# Patient Record
Sex: Male | Born: 1965 | Race: White | Hispanic: No | Marital: Married | Smoking: Former smoker
Health system: Southern US, Community
[De-identification: ages and names within clinical notes are randomized; demographics above are authoritative.]

## PROBLEM LIST (undated history)

## (undated) DIAGNOSIS — I739 Peripheral vascular disease, unspecified: Secondary | ICD-10-CM

## (undated) DIAGNOSIS — K219 Gastro-esophageal reflux disease without esophagitis: Secondary | ICD-10-CM

## (undated) DIAGNOSIS — Z720 Tobacco use: Secondary | ICD-10-CM

## (undated) DIAGNOSIS — I10 Essential (primary) hypertension: Secondary | ICD-10-CM

## (undated) DIAGNOSIS — E119 Type 2 diabetes mellitus without complications: Secondary | ICD-10-CM

## (undated) DIAGNOSIS — K811 Chronic cholecystitis: Secondary | ICD-10-CM

## (undated) DIAGNOSIS — I219 Acute myocardial infarction, unspecified: Secondary | ICD-10-CM

## (undated) DIAGNOSIS — F419 Anxiety disorder, unspecified: Secondary | ICD-10-CM

## (undated) HISTORY — PX: KNEE ARTHROSCOPY: SUR90

## (undated) HISTORY — DX: Peripheral vascular disease, unspecified: I73.9

---

## 2001-03-15 ENCOUNTER — Inpatient Hospital Stay (HOSPITAL_COMMUNITY): Admission: AD | Admit: 2001-03-15 | Discharge: 2001-03-18 | Payer: Self-pay | Admitting: Internal Medicine

## 2004-01-10 HISTORY — PX: CARDIAC CATHETERIZATION: SHX172

## 2004-03-11 ENCOUNTER — Ambulatory Visit (HOSPITAL_COMMUNITY): Admission: RE | Admit: 2004-03-11 | Discharge: 2004-03-11 | Payer: Self-pay | Admitting: Internal Medicine

## 2014-12-29 DIAGNOSIS — I2510 Atherosclerotic heart disease of native coronary artery without angina pectoris: Secondary | ICD-10-CM

## 2014-12-29 HISTORY — DX: Atherosclerotic heart disease of native coronary artery without angina pectoris: I25.10

## 2015-01-06 ENCOUNTER — Encounter (HOSPITAL_COMMUNITY): Payer: Self-pay | Admitting: Physician Assistant

## 2015-01-06 ENCOUNTER — Inpatient Hospital Stay (HOSPITAL_COMMUNITY)
Admission: EM | Admit: 2015-01-06 | Discharge: 2015-01-12 | DRG: 234 | Disposition: A | Discharge status: Home / Self Care | Payer: BLUE CROSS/BLUE SHIELD | Source: Other Acute Inpatient Hospital | Attending: Surgery | Admitting: Surgery

## 2015-01-06 DIAGNOSIS — I4892 Unspecified atrial flutter: Secondary | ICD-10-CM | POA: Diagnosis not present

## 2015-01-06 DIAGNOSIS — I4891 Unspecified atrial fibrillation: Secondary | ICD-10-CM | POA: Diagnosis not present

## 2015-01-06 DIAGNOSIS — F17210 Nicotine dependence, cigarettes, uncomplicated: Secondary | ICD-10-CM | POA: Diagnosis not present

## 2015-01-06 DIAGNOSIS — J90 Pleural effusion, not elsewhere classified: Secondary | ICD-10-CM | POA: Diagnosis not present

## 2015-01-06 DIAGNOSIS — Z79899 Other long term (current) drug therapy: Secondary | ICD-10-CM

## 2015-01-06 DIAGNOSIS — Z720 Tobacco use: Secondary | ICD-10-CM | POA: Diagnosis present

## 2015-01-06 DIAGNOSIS — E8770 Fluid overload, unspecified: Secondary | ICD-10-CM | POA: Diagnosis not present

## 2015-01-06 DIAGNOSIS — R079 Chest pain, unspecified: Secondary | ICD-10-CM | POA: Diagnosis present

## 2015-01-06 DIAGNOSIS — I25119 Atherosclerotic heart disease of native coronary artery with unspecified angina pectoris: Secondary | ICD-10-CM | POA: Diagnosis not present

## 2015-01-06 DIAGNOSIS — K219 Gastro-esophageal reflux disease without esophagitis: Secondary | ICD-10-CM | POA: Diagnosis not present

## 2015-01-06 DIAGNOSIS — F419 Anxiety disorder, unspecified: Secondary | ICD-10-CM | POA: Diagnosis present

## 2015-01-06 DIAGNOSIS — I214 Non-ST elevation (NSTEMI) myocardial infarction: Secondary | ICD-10-CM | POA: Diagnosis not present

## 2015-01-06 DIAGNOSIS — I10 Essential (primary) hypertension: Secondary | ICD-10-CM | POA: Diagnosis present

## 2015-01-06 DIAGNOSIS — Z951 Presence of aortocoronary bypass graft: Secondary | ICD-10-CM

## 2015-01-06 DIAGNOSIS — E119 Type 2 diabetes mellitus without complications: Secondary | ICD-10-CM | POA: Diagnosis not present

## 2015-01-06 DIAGNOSIS — Z7984 Long term (current) use of oral hypoglycemic drugs: Secondary | ICD-10-CM

## 2015-01-06 HISTORY — DX: Anxiety disorder, unspecified: F41.9

## 2015-01-06 HISTORY — DX: Essential (primary) hypertension: I10

## 2015-01-06 HISTORY — DX: Tobacco use: Z72.0

## 2015-01-06 HISTORY — DX: Type 2 diabetes mellitus without complications: E11.9

## 2015-01-06 HISTORY — DX: Gastro-esophageal reflux disease without esophagitis: K21.9

## 2015-01-06 LAB — HEPARIN LEVEL (UNFRACTIONATED)
HEPARIN UNFRACTIONATED: 0.51 [IU]/mL (ref 0.30–0.70)
Heparin Unfractionated: 0.19 IU/mL — ABNORMAL LOW (ref 0.30–0.70)

## 2015-01-06 LAB — MAGNESIUM: MAGNESIUM: 2 mg/dL (ref 1.7–2.4)

## 2015-01-06 LAB — GLUCOSE, CAPILLARY
GLUCOSE-CAPILLARY: 215 mg/dL — AB (ref 65–99)
Glucose-Capillary: 100 mg/dL — ABNORMAL HIGH (ref 65–99)
Glucose-Capillary: 126 mg/dL — ABNORMAL HIGH (ref 65–99)

## 2015-01-06 LAB — TROPONIN I
TROPONIN I: 1.22 ng/mL — AB (ref <0.031)
Troponin I: 1.74 ng/mL (ref <0.031)

## 2015-01-06 LAB — PROTIME-INR
INR: 1.01 (ref 0.00–1.49)
Prothrombin Time: 13.5 seconds (ref 11.6–15.2)

## 2015-01-06 LAB — TSH: TSH: 0.816 u[IU]/mL (ref 0.350–4.500)

## 2015-01-06 MED ORDER — CARVEDILOL 3.125 MG PO TABS
3.1250 mg | ORAL_TABLET | Freq: Two times a day (BID) | ORAL | Status: DC
  Administered 2015-01-06 – 2015-01-07 (×2): 3.125 mg via ORAL
  Filled 2015-01-06 (×2): qty 1

## 2015-01-06 MED ORDER — ALPRAZOLAM 0.5 MG PO TABS: 0.5000 mg | ORAL_TABLET | Freq: Three times a day (TID) | ORAL | Status: DC | PRN

## 2015-01-06 MED ORDER — ACETAMINOPHEN 325 MG PO TABS: 650.0000 mg | ORAL_TABLET | ORAL | Status: DC | PRN

## 2015-01-06 MED ORDER — ATORVASTATIN CALCIUM 80 MG PO TABS
80.0000 mg | ORAL_TABLET | Freq: Every day | ORAL | Status: DC
Start: 2015-01-06 — End: 2015-01-12
  Administered 2015-01-06 – 2015-01-11 (×5): 80 mg via ORAL
  Filled 2015-01-06 (×5): qty 1

## 2015-01-06 MED ORDER — HEPARIN BOLUS VIA INFUSION
2500.0000 [IU] | Freq: Once | INTRAVENOUS | Status: AC
  Administered 2015-01-06: 2500 [IU] via INTRAVENOUS
  Filled 2015-01-06: qty 2500

## 2015-01-06 MED ORDER — ONDANSETRON HCL 4 MG/2ML IJ SOLN: 4.0000 mg | Freq: Four times a day (QID) | INTRAMUSCULAR | Status: DC | PRN

## 2015-01-06 MED ORDER — INSULIN ASPART 100 UNIT/ML ~~LOC~~ SOLN
0.0000 [IU] | Freq: Three times a day (TID) | SUBCUTANEOUS | Status: DC
  Administered 2015-01-06 – 2015-01-07 (×2): 2 [IU] via SUBCUTANEOUS

## 2015-01-06 MED ORDER — SODIUM CHLORIDE 0.9 % IJ SOLN
3.0000 mL | Freq: Two times a day (BID) | INTRAMUSCULAR | Status: DC
  Administered 2015-01-07: 3 mL via INTRAVENOUS

## 2015-01-06 MED ORDER — ASPIRIN 300 MG RE SUPP: 300.0000 mg | RECTAL | Status: DC

## 2015-01-06 MED ORDER — SODIUM CHLORIDE 0.9 % IV SOLN: 250.0000 mL | INTRAVENOUS | Status: DC | PRN

## 2015-01-06 MED ORDER — HEPARIN (PORCINE) IN NACL 100-0.45 UNIT/ML-% IJ SOLN
1350.0000 [IU]/h | INTRAMUSCULAR | Status: DC
  Administered 2015-01-07: 1350 [IU]/h via INTRAVENOUS
  Filled 2015-01-06: qty 250

## 2015-01-06 MED ORDER — SODIUM CHLORIDE 0.9 % IJ SOLN: 3.0000 mL | INTRAMUSCULAR | Status: DC | PRN

## 2015-01-06 MED ORDER — ASPIRIN 81 MG PO CHEW
81.0000 mg | CHEWABLE_TABLET | ORAL | Status: AC
  Administered 2015-01-07: 81 mg via ORAL
  Filled 2015-01-06: qty 1

## 2015-01-06 MED ORDER — SODIUM CHLORIDE 0.9 % IJ SOLN
3.0000 mL | Freq: Two times a day (BID) | INTRAMUSCULAR | Status: DC
  Administered 2015-01-06: 3 mL via INTRAVENOUS

## 2015-01-06 MED ORDER — ASPIRIN 81 MG PO CHEW: 324.0000 mg | CHEWABLE_TABLET | ORAL | Status: DC

## 2015-01-06 MED ORDER — ASPIRIN EC 81 MG PO TBEC: 81.0000 mg | DELAYED_RELEASE_TABLET | Freq: Every day | ORAL | Status: DC

## 2015-01-06 MED ORDER — SODIUM CHLORIDE 0.9 % WEIGHT BASED INFUSION
3.0000 mL/kg/h | INTRAVENOUS | Status: DC
  Administered 2015-01-07: 3 mL/kg/h via INTRAVENOUS
  Administered 2015-01-07: 250 mL via INTRAVENOUS

## 2015-01-06 MED ORDER — PANTOPRAZOLE SODIUM 40 MG PO TBEC
40.0000 mg | DELAYED_RELEASE_TABLET | Freq: Every day | ORAL | Status: DC
  Administered 2015-01-06 – 2015-01-07 (×2): 40 mg via ORAL
  Filled 2015-01-06 (×2): qty 1

## 2015-01-06 MED ORDER — SODIUM CHLORIDE 0.9 % WEIGHT BASED INFUSION
1.0000 mL/kg/h | INTRAVENOUS | Status: DC
  Administered 2015-01-07: 1 mL/kg/h via INTRAVENOUS

## 2015-01-06 MED ORDER — NITROGLYCERIN 0.4 MG SL SUBL: 0.4000 mg | SUBLINGUAL_TABLET | SUBLINGUAL | Status: DC | PRN

## 2015-01-06 NOTE — H&P (Signed)
Patient ID: Logan Reynolds MRN: [id number], DOB/AGE: 1965/08/07   Admit date: 01/06/2015   Primary Physician: No primary care provider on file. Primary Cardiologist: New- Dr Elease Hashimoto  Pt. Profile:  Logan Reynolds is a 49 y.o. male with a history of HTN, DMT2, tobacco abuse, non obst CAD ( by cath in 2006 per patient report), GERD and anxiety who was transferred from [facility] today for a NSTEMI.  He has been having on and off chest pain for the last couple weeks. He attributed this to a recent sinus infection and coughing. He works as an Personnel officer and also felt like he could've strained his chest while at work. He has a history of hypertension but patient reports only having it while at the doctor's office. He was on a blood pressure medication but has not been taking it for quite some time. He was diagnosed with diabetes mellitus 4 years ago and is compliant with his metformin. He reports having had a heart catheterization with [place] [facility] around 2006. I cannot find any records of this. Per patient and wife he had mild nonobstructive disease in 2 of his arteries. He continues to smoke 1-1-1/2 packs per day. He quit drinking about 15 years ago. He denies illicit drug use.  Last night he woke up around 1:45 AM with severe left-sided chest pressure that radiated to his left arm and was associated with diaphoresis and shortness of breath. He presented to [facility] where he was found to have ST depression in inferior leads. These resolved after Nitropaste/IV heparin. His first troponin returned normal, but his second troponin returned at 0.10. He was placed on heparin and transferred to [facility] for further evaluation. He is currently chest pain free.   LABS AND IMAGING AT [facility]: Chest x-ray clear. Creatinine 0.95. INR 0.9. WBC 11.6. H/H; 17/50.8. D-dimer negative. His first troponin T was negative followed by an abnormal result 0.10.     Problem List  Past Medical History  Diagnosis Date  . HTN (hypertension)   . Tobacco abuse   . Diabetes mellitus, type 2 (HCC)   . GERD (gastroesophageal reflux disease)   . Anxiety     No past surgical history on file.   Allergies  No Known Allergies   Home Medications  Prior to Admission medications   Medication Sig Start Date End Date Taking? Authorizing Provider  ALPRAZolam Prudy Feeler) 0.5 MG tablet Take 0.5 mg by mouth 3 (three) times daily as needed for anxiety.   Yes Historical Provider, MD  metFORMIN (GLUCOPHAGE) 500 MG tablet Take 500 mg by mouth 2 (two) times daily with a meal.   Yes Historical Provider, MD  omeprazole (PRILOSEC) 20 MG capsule Take 20 mg by mouth daily.   Yes Historical Provider, MD    Family History  Family History  Problem Relation Age of Onset  . Cancer Mother     ovarian  . Cancer Father     bone cancer  . Diabetes Maternal Grandmother   . Diabetes Brother   . Heart attack Father     CABG x5V   Family Status  Relation Status Death Age  . Mother Deceased     ovarian cancer  . Father Deceased     bone cancer     Social History  Social History   Social History  . Marital Status: Married    Spouse Name: N/A  . Number of Children: N/A  . Years of  Education: N/A   Occupational History  . Not on file.   Social History Main Topics  . Smoking status: Current Every Day Smoker  . Smokeless tobacco: Not on file  . Alcohol Use: Not on file  . Drug Use: Not on file  . Sexual Activity: Not on file   Other Topics Concern  . Not on file   Social History Narrative  . No narrative on file      All other systems reviewed and are otherwise negative except as noted above.  Physical Exam  Pulse 59, temperature 97.4 F (36.3 C), temperature source Oral, resp. rate 17.  General: Pleasant, NAD Psych: Normal affect. Neuro: Alert and oriented X 3. Moves all extremities spontaneously. HEENT: Normal  Neck: Supple without bruits  or JVD. Lungs:  Resp regular and unlabored, CTA. Heart: RRR no s3, s4, or murmurs. Abdomen: Soft, non-tender, non-distended, BS + x 4.  Extremities: No clubbing, cyanosis or edema. DP/PT/Radials 2+ and equal bilaterally.  Labs  See HPI ( labs from [facility])     Radiology/Studies  No results found.  ECG  NSR HR 75: possible old anteroseptal infarct. ST/TW changes in II, III and AVF  ASSESSMENT AND PLAN  Logan Reynolds is a 49 y.o. male with a history of HTN, DMT2, tobacco abuse, non obst CAD ( by cath in 2006 per patient report), GERD and anxiety who was [facility] for a NSTEMI.  NSTEMI: [facility] with inferior changes that resolved after nitro paste and IV heparin. He is currently chest pain free. -- [facility]rop[facility]. Will continue to cycle enzymes and repeat ECG here.  -- Will continue heparin gtt per pharmacy. Will plan for LHC today, but if no room we can put off until tomorrow as he is stable.  -- I added coreg 3.125mg  BID and atorvastatin 80mg   Tobacco abuse: counseled on cessation  HTN: patient reports only white coat HTN. Added low dose BB as above. Will continue to monitor  DMT2: will [facility] and [facility] on S[facility]   Signed, Janetta HoraHOMPSON, KATHRYN R, PA-C 01/06/2015, 12:04 PM  Pager [phone]  Attending Note:   The patient was seen and examined.  Agree with assessment and plan as noted above.  Changes made to the above note as needed.  Pt presents with typical angina symptoms associated with ST depression in the inferior leads. Symptoms and eCG improved with heparin and NTP Was transferred down for cath . No elective slots available today.   For now he is stable. Anticipate cath tomorrow   Vesta MixerPhilip J. Clema Skousen, Montez HagemanJr., MD, [facility] 01/06/2015, 12:34 PM [address],  [address] Office [phone]- [phone] Pager [phone]- [phone]

## 2015-01-06 NOTE — Progress Notes (Addendum)
ANTICOAGULATION CONSULT NOTE - Initial Consult  Pharmacy Consult for heparin Indication: chest pain/ACS  No Known Allergies  Patient Measurements:    Weight: 82.7 kg per family member  Vital Signs: Temp: 97.4 F (36.3 C) (12/28 1156) Temp Source: Oral (12/28 1156) Pulse Rate: 59 (12/28 1156)  Labs: No results for input(s): HGB, HCT, PLT, APTT, LABPROT, INR, HEPARINUNFRC, CREATININE, CKTOTAL, CKMB, TROPONINI in the last 72 hours.  CrCl cannot be calculated (Unknown ideal weight.).   Medical History: Past Medical History  Diagnosis Date  . HTN (hypertension)   . Tobacco abuse   . Diabetes mellitus, type 2 (HCC)   . GERD (gastroesophageal reflux disease)   . Anxiety     Medications:  Protonix, xanax prn, metformin  Assessment: 49 yo M [facility].  Pharmacy consulted to dose heparin for NSTEMI. [facility] creat 0.95; INR 0.9, H/H 17/50.8, D dimer neg. Trop 0.1,  PMH sign for HTN ( pt reports only white coat HTN) , DM2, tobacco, CAD, GERD, anxiety.  Home meds: prn xanax, protonix and metformin.  [facility] with 4000 units bolus at 0610 and is running at 1000 units/hr.   [facility] family member with wt of 182 pounds = 82.7 kg.   Goal of Therapy:  Heparin level 0.3-0.7 units/ml Monitor platelets by anticoagulation protocol: Yes   Plan:  - continue current heparin drip at 1000 units/hr - check HL now  - daily HL and CBC - for cath 12/29  Herby AbrahamMichelle T. Bell, Pharm.D. [phone] 01/06/2015 1:28 PM   HL is 0.19 after 4000 unit bolus and 1000 units/hr.  HL below goal.  No bleeding noted.  Plan: 2500 unit bolus, increase drip to 1350 units/hr and check 6 hr HL  Herby AbrahamMichelle T. Bell, Pharm.D. [phone] 01/06/2015 2:13 PM   Addendum: Heparin level is now therapeutic at 0.51. Continue heparin at 1350 units/hr and follow up AM labs.  Louie CasaJennifer Kaianna Dolezal, PharmD, BCPS 01/06/2015, 10:19 PM

## 2015-01-07 ENCOUNTER — Other Ambulatory Visit (HOSPITAL_COMMUNITY): Payer: Self-pay

## 2015-01-07 ENCOUNTER — Inpatient Hospital Stay (HOSPITAL_COMMUNITY): Payer: BLUE CROSS/BLUE SHIELD | Admitting: Certified Registered"

## 2015-01-07 ENCOUNTER — Inpatient Hospital Stay (HOSPITAL_COMMUNITY): Payer: BLUE CROSS/BLUE SHIELD

## 2015-01-07 ENCOUNTER — Encounter (HOSPITAL_COMMUNITY): Payer: Self-pay | Admitting: Cardiology

## 2015-01-07 ENCOUNTER — Encounter (HOSPITAL_COMMUNITY)
Admission: EM | Disposition: A | Discharge status: Home / Self Care | Payer: Self-pay | Source: Other Acute Inpatient Hospital | Attending: Surgery

## 2015-01-07 DIAGNOSIS — E8770 Fluid overload, unspecified: Secondary | ICD-10-CM | POA: Diagnosis not present

## 2015-01-07 DIAGNOSIS — F17210 Nicotine dependence, cigarettes, uncomplicated: Secondary | ICD-10-CM | POA: Diagnosis present

## 2015-01-07 DIAGNOSIS — K219 Gastro-esophageal reflux disease without esophagitis: Secondary | ICD-10-CM | POA: Diagnosis present

## 2015-01-07 DIAGNOSIS — Z79899 Other long term (current) drug therapy: Secondary | ICD-10-CM | POA: Diagnosis not present

## 2015-01-07 DIAGNOSIS — I25110 Atherosclerotic heart disease of native coronary artery with unstable angina pectoris: Secondary | ICD-10-CM | POA: Diagnosis not present

## 2015-01-07 DIAGNOSIS — F419 Anxiety disorder, unspecified: Secondary | ICD-10-CM | POA: Diagnosis present

## 2015-01-07 DIAGNOSIS — R079 Chest pain, unspecified: Secondary | ICD-10-CM | POA: Diagnosis present

## 2015-01-07 DIAGNOSIS — J90 Pleural effusion, not elsewhere classified: Secondary | ICD-10-CM | POA: Diagnosis not present

## 2015-01-07 DIAGNOSIS — I4892 Unspecified atrial flutter: Secondary | ICD-10-CM | POA: Diagnosis not present

## 2015-01-07 DIAGNOSIS — I219 Acute myocardial infarction, unspecified: Secondary | ICD-10-CM

## 2015-01-07 DIAGNOSIS — I2510 Atherosclerotic heart disease of native coronary artery without angina pectoris: Secondary | ICD-10-CM | POA: Diagnosis not present

## 2015-01-07 DIAGNOSIS — I25119 Atherosclerotic heart disease of native coronary artery with unspecified angina pectoris: Secondary | ICD-10-CM | POA: Diagnosis present

## 2015-01-07 DIAGNOSIS — Z7984 Long term (current) use of oral hypoglycemic drugs: Secondary | ICD-10-CM | POA: Diagnosis not present

## 2015-01-07 DIAGNOSIS — I10 Essential (primary) hypertension: Secondary | ICD-10-CM | POA: Diagnosis present

## 2015-01-07 DIAGNOSIS — Z951 Presence of aortocoronary bypass graft: Secondary | ICD-10-CM

## 2015-01-07 DIAGNOSIS — I214 Non-ST elevation (NSTEMI) myocardial infarction: Secondary | ICD-10-CM | POA: Diagnosis present

## 2015-01-07 DIAGNOSIS — I4891 Unspecified atrial fibrillation: Secondary | ICD-10-CM | POA: Diagnosis not present

## 2015-01-07 DIAGNOSIS — E119 Type 2 diabetes mellitus without complications: Secondary | ICD-10-CM | POA: Diagnosis present

## 2015-01-07 HISTORY — DX: Acute myocardial infarction, unspecified: I21.9

## 2015-01-07 HISTORY — PX: CARDIAC CATHETERIZATION: SHX172

## 2015-01-07 HISTORY — PX: CORONARY ARTERY BYPASS GRAFT: SHX141

## 2015-01-07 LAB — APTT
aPTT: >200 seconds (ref 24–37)
aPTT: 36 seconds (ref 24–37)

## 2015-01-07 LAB — COMPREHENSIVE METABOLIC PANEL
ALBUMIN: 3.3 g/dL — AB (ref 3.5–5.0)
ALK PHOS: 54 U/L (ref 38–126)
ALK PHOS: 62 U/L (ref 38–126)
ALT: 22 U/L (ref 17–63)
ALT: 21 U/L (ref 17–63)
ANION GAP: 8 (ref 5–15)
ANION GAP: 10 (ref 5–15)
AST: 24 U/L (ref 15–41)
AST: 20 U/L (ref 15–41)
Albumin: 3.9 g/dL (ref 3.5–5.0)
BUN: 16 mg/dL (ref 6–20)
BUN: 13 mg/dL (ref 6–20)
CALCIUM: 8.4 mg/dL — AB (ref 8.9–10.3)
CALCIUM: 9.2 mg/dL (ref 8.9–10.3)
CHLORIDE: 107 mmol/L (ref 101–111)
CO2: 24 mmol/L (ref 22–32)
CO2: 24 mmol/L (ref 22–32)
CREATININE: 0.91 mg/dL (ref 0.61–1.24)
Chloride: 104 mmol/L (ref 101–111)
Creatinine, Ser: 1.1 mg/dL (ref 0.61–1.24)
GFR calc non Af Amer: >60 mL/min (ref >60)
GFR calc non Af Amer: >60 mL/min (ref >60)
GLUCOSE: 130 mg/dL — AB (ref 65–99)
Glucose, Bld: 196 mg/dL — ABNORMAL HIGH (ref 65–99)
POTASSIUM: 4 mmol/L (ref 3.5–5.1)
Potassium: 4.1 mmol/L (ref 3.5–5.1)
SODIUM: 139 mmol/L (ref 135–145)
SODIUM: 138 mmol/L (ref 135–145)
Total Bilirubin: 0.4 mg/dL (ref 0.3–1.2)
Total Bilirubin: 0.8 mg/dL (ref 0.3–1.2)
Total Protein: 6.6 g/dL (ref 6.5–8.1)
Total Protein: 5.7 g/dL — ABNORMAL LOW (ref 6.5–8.1)

## 2015-01-07 LAB — CBC
HCT: 41.7 % (ref 39.0–52.0)
HEMATOCRIT: 45.9 % (ref 39.0–52.0)
HEMATOCRIT: 42.1 % (ref 39.0–52.0)
HEMATOCRIT: 49.1 % (ref 39.0–52.0)
HEMOGLOBIN: 15 g/dL (ref 13.0–17.0)
Hemoglobin: 16.2 g/dL (ref 13.0–17.0)
Hemoglobin: 13.7 g/dL (ref 13.0–17.0)
Hemoglobin: 14.1 g/dL (ref 13.0–17.0)
MCH: 29.7 pg (ref 26.0–34.0)
MCH: 29.3 pg (ref 26.0–34.0)
MCH: 29.3 pg (ref 26.0–34.0)
MCH: 30.1 pg (ref 26.0–34.0)
MCHC: 32.7 g/dL (ref 30.0–36.0)
MCHC: 32.9 g/dL (ref 30.0–36.0)
MCHC: 33 g/dL (ref 30.0–36.0)
MCHC: 33.5 g/dL (ref 30.0–36.0)
MCV: 89.6 fL (ref 78.0–100.0)
MCV: 89.1 fL (ref 78.0–100.0)
MCV: 89.8 fL (ref 78.0–100.0)
MCV: 89.9 fL (ref 78.0–100.0)
PLATELETS: 207 10*3/uL (ref 150–400)
PLATELETS: 138 10*3/uL — AB (ref 150–400)
PLATELETS: 135 10*3/uL — AB (ref 150–400)
Platelets: 227 10*3/uL (ref 150–400)
RBC: 5.46 MIL/uL (ref 4.22–5.81)
RBC: 5.12 MIL/uL (ref 4.22–5.81)
RBC: 4.68 MIL/uL (ref 4.22–5.81)
RBC: 4.69 MIL/uL (ref 4.22–5.81)
RDW: 14.2 % (ref 11.5–15.5)
RDW: 14.2 % (ref 11.5–15.5)
RDW: 14.4 % (ref 11.5–15.5)
RDW: 14.2 % (ref 11.5–15.5)
WBC: 8.8 10*3/uL (ref 4.0–10.5)
WBC: 9.6 10*3/uL (ref 4.0–10.5)
WBC: 13.3 10*3/uL — ABNORMAL HIGH (ref 4.0–10.5)
WBC: 14.5 10*3/uL — AB (ref 4.0–10.5)

## 2015-01-07 LAB — POCT I-STAT 3, ART BLOOD GAS (G3+)
ACID-BASE DEFICIT: 3 mmol/L — AB (ref 0.0–2.0)
ACID-BASE DEFICIT: 2 mmol/L (ref 0.0–2.0)
ACID-BASE DEFICIT: 2 mmol/L (ref 0.0–2.0)
BICARBONATE: 23.1 meq/L (ref 20.0–24.0)
BICARBONATE: 23.8 meq/L (ref 20.0–24.0)
Bicarbonate: 25 mEq/L — ABNORMAL HIGH (ref 20.0–24.0)
O2 SAT: 95 %
O2 SAT: 95 %
O2 Saturation: 95 %
PO2 ART: 83 mmHg (ref 80.0–100.0)
TCO2: 25 mmol/L (ref 0–100)
TCO2: 26 mmol/L (ref 0–100)
TCO2: 25 mmol/L (ref 0–100)
pCO2 arterial: 43.1 mmHg (ref 35.0–45.0)
pCO2 arterial: 47.9 mmHg — ABNORMAL HIGH (ref 35.0–45.0)
pCO2 arterial: 44 mmHg (ref 35.0–45.0)
pH, Arterial: 7.331 — ABNORMAL LOW (ref 7.350–7.450)
pH, Arterial: 7.325 — ABNORMAL LOW (ref 7.350–7.450)
pH, Arterial: 7.34 — ABNORMAL LOW (ref 7.350–7.450)
pO2, Arterial: 74 mmHg — ABNORMAL LOW (ref 80.0–100.0)
pO2, Arterial: 81 mmHg (ref 80.0–100.0)

## 2015-01-07 LAB — HEPARIN LEVEL (UNFRACTIONATED): Heparin Unfractionated: 0.37 IU/mL (ref 0.30–0.70)

## 2015-01-07 LAB — LIPID PANEL
Cholesterol: 160 mg/dL (ref 0–200)
Cholesterol: 130 mg/dL (ref 0–200)
HDL: 38 mg/dL — AB (ref >40)
HDL: 37 mg/dL — AB (ref >40)
LDL CALC: 84 mg/dL (ref 0–99)
LDL CALC: 94 mg/dL (ref 0–99)
TRIGLYCERIDES: 42 mg/dL (ref <150)
TRIGLYCERIDES: 146 mg/dL (ref <150)
Total CHOL/HDL Ratio: 4.3 RATIO
Total CHOL/HDL Ratio: 3.4 RATIO
VLDL: 29 mg/dL (ref 0–40)
VLDL: 8 mg/dL (ref 0–40)

## 2015-01-07 LAB — HEMOGLOBIN A1C
Hgb A1c MFr Bld: 6.6 % — ABNORMAL HIGH (ref 4.8–5.6)
Mean Plasma Glucose: 143 mg/dL

## 2015-01-07 LAB — CK TOTAL AND CKMB (NOT AT ARMC)
CK, MB: 4 ng/mL (ref 0.5–5.0)
Relative Index: INVALID (ref 0.0–2.5)
Total CK: 51 U/L (ref 49–397)

## 2015-01-07 LAB — CREATININE, SERUM: Creatinine, Ser: 0.79 mg/dL (ref 0.61–1.24)

## 2015-01-07 LAB — HEMOGLOBIN AND HEMATOCRIT, BLOOD
HEMATOCRIT: 36.5 % — AB (ref 39.0–52.0)
Hemoglobin: 12.2 g/dL — ABNORMAL LOW (ref 13.0–17.0)

## 2015-01-07 LAB — TYPE AND SCREEN
ABO/RH(D): A POS
Antibody Screen: NEGATIVE

## 2015-01-07 LAB — TROPONIN I
TROPONIN I: 0.64 ng/mL — AB (ref <0.031)
Troponin I: 0.94 ng/mL (ref <0.031)

## 2015-01-07 LAB — MAGNESIUM: Magnesium: 2.9 mg/dL — ABNORMAL HIGH (ref 1.7–2.4)

## 2015-01-07 LAB — POCT I-STAT, CHEM 8
BUN: 12 mg/dL (ref 6–20)
CALCIUM ION: 1.19 mmol/L (ref 1.12–1.23)
CREATININE: 0.7 mg/dL (ref 0.61–1.24)
Chloride: 107 mmol/L (ref 101–111)
Glucose, Bld: 116 mg/dL — ABNORMAL HIGH (ref 65–99)
HEMATOCRIT: 43 % (ref 39.0–52.0)
HEMOGLOBIN: 14.6 g/dL (ref 13.0–17.0)
Potassium: 4.7 mmol/L (ref 3.5–5.1)
SODIUM: 140 mmol/L (ref 135–145)
TCO2: 23 mmol/L (ref 0–100)

## 2015-01-07 LAB — POCT I-STAT 4, (NA,K, GLUC, HGB,HCT)
Glucose, Bld: 133 mg/dL — ABNORMAL HIGH (ref 65–99)
HCT: 39 % (ref 39.0–52.0)
Hemoglobin: 13.3 g/dL (ref 13.0–17.0)
Potassium: 4 mmol/L (ref 3.5–5.1)
Sodium: 138 mmol/L (ref 135–145)

## 2015-01-07 LAB — PROTIME-INR
INR: 1.19 (ref 0.00–1.49)
INR: 1.31 (ref 0.00–1.49)
INR: 1.04 (ref 0.00–1.49)
PROTHROMBIN TIME: 15.3 s — AB (ref 11.6–15.2)
Prothrombin Time: 13.8 seconds (ref 11.6–15.2)
Prothrombin Time: 16.4 seconds — ABNORMAL HIGH (ref 11.6–15.2)

## 2015-01-07 LAB — PLATELET COUNT: Platelets: 120 10*3/uL — ABNORMAL LOW (ref 150–400)

## 2015-01-07 LAB — GLUCOSE, CAPILLARY
GLUCOSE-CAPILLARY: 104 mg/dL — AB (ref 65–99)
GLUCOSE-CAPILLARY: 125 mg/dL — AB (ref 65–99)
Glucose-Capillary: 136 mg/dL — ABNORMAL HIGH (ref 65–99)
Glucose-Capillary: 107 mg/dL — ABNORMAL HIGH (ref 65–99)

## 2015-01-07 LAB — ABO/RH: ABO/RH(D): A POS

## 2015-01-07 LAB — MRSA PCR SCREENING: MRSA BY PCR: NEGATIVE

## 2015-01-07 SURGERY — LEFT HEART CATH AND CORONARY ANGIOGRAPHY

## 2015-01-07 SURGERY — CORONARY ARTERY BYPASS GRAFTING (CABG)
Anesthesia: General | Site: Chest

## 2015-01-07 MED ORDER — ALBUMIN HUMAN 5 % IV SOLN
250.0000 mL | INTRAVENOUS | Status: AC | PRN
Start: 2015-01-07 — End: 2015-01-08
  Administered 2015-01-07 (×3): 250 mL via INTRAVENOUS
  Filled 2015-01-07 (×2): qty 250

## 2015-01-07 MED ORDER — ONDANSETRON HCL 4 MG/2ML IJ SOLN: 4.0000 mg | Freq: Four times a day (QID) | INTRAMUSCULAR | Status: DC | PRN

## 2015-01-07 MED ORDER — PROPOFOL 10 MG/ML IV BOLUS
INTRAVENOUS | Status: AC
  Filled 2015-01-07: qty 20

## 2015-01-07 MED ORDER — CHLORHEXIDINE GLUCONATE 0.12% ORAL RINSE (MEDLINE KIT)
15.0000 mL | Freq: Two times a day (BID) | OROMUCOSAL | Status: DC
  Administered 2015-01-08 (×2): 15 mL via OROMUCOSAL

## 2015-01-07 MED ORDER — ACETAMINOPHEN 650 MG RE SUPP
650.0000 mg | Freq: Once | RECTAL | Status: AC
  Administered 2015-01-07: 650 mg via RECTAL

## 2015-01-07 MED ORDER — ROCURONIUM BROMIDE 50 MG/5ML IV SOLN
INTRAVENOUS | Status: AC
  Filled 2015-01-07: qty 5

## 2015-01-07 MED ORDER — POTASSIUM CHLORIDE 2 MEQ/ML IV SOLN
80.0000 meq | INTRAVENOUS | Status: DC
  Filled 2015-01-07: qty 40

## 2015-01-07 MED ORDER — SODIUM CHLORIDE 0.9 % IJ SOLN
INTRAMUSCULAR | Status: AC
  Filled 2015-01-07: qty 10

## 2015-01-07 MED ORDER — FENTANYL CITRATE (PF) 250 MCG/5ML IJ SOLN
INTRAMUSCULAR | Status: DC | PRN
  Administered 2015-01-07 (×2): 150 ug via INTRAVENOUS
  Administered 2015-01-07: 250 ug via INTRAVENOUS
  Administered 2015-01-07: 100 ug via INTRAVENOUS
  Administered 2015-01-07: 300 ug via INTRAVENOUS
  Administered 2015-01-07 (×3): 100 ug via INTRAVENOUS

## 2015-01-07 MED ORDER — DOPAMINE-DEXTROSE 3.2-5 MG/ML-% IV SOLN
0.0000 ug/kg/min | INTRAVENOUS | Status: DC
  Filled 2015-01-07: qty 250

## 2015-01-07 MED ORDER — ARTIFICIAL TEARS OP OINT
TOPICAL_OINTMENT | OPHTHALMIC | Status: DC | PRN
  Administered 2015-01-07: 1 via OPHTHALMIC

## 2015-01-07 MED ORDER — CALCIUM CHLORIDE 10 % IV SOLN
INTRAVENOUS | Status: AC
  Filled 2015-01-07: qty 10

## 2015-01-07 MED ORDER — DEXMEDETOMIDINE HCL IN NACL 400 MCG/100ML IV SOLN
0.1000 ug/kg/h | INTRAVENOUS | Status: DC
  Filled 2015-01-07: qty 100

## 2015-01-07 MED ORDER — MORPHINE SULFATE (PF) 2 MG/ML IV SOLN
2.0000 mg | INTRAVENOUS | Status: DC | PRN
  Administered 2015-01-07: 2 mg via INTRAVENOUS
  Administered 2015-01-07 – 2015-01-08 (×4): 4 mg via INTRAVENOUS
  Administered 2015-01-08 (×2): 2 mg via INTRAVENOUS
  Filled 2015-01-07 (×2): qty 1
  Filled 2015-01-07: qty 2
  Filled 2015-01-07 (×2): qty 1
  Filled 2015-01-07 (×3): qty 2

## 2015-01-07 MED ORDER — HEMOSTATIC AGENTS (NO CHARGE) OPTIME
TOPICAL | Status: DC | PRN
  Administered 2015-01-07: 1 via TOPICAL

## 2015-01-07 MED ORDER — ARTIFICIAL TEARS OP OINT
TOPICAL_OINTMENT | OPHTHALMIC | Status: AC
  Filled 2015-01-07: qty 3.5

## 2015-01-07 MED ORDER — DOPAMINE-DEXTROSE 3.2-5 MG/ML-% IV SOLN: 0.0000 ug/kg/min | INTRAVENOUS | Status: DC

## 2015-01-07 MED ORDER — PROTAMINE SULFATE 10 MG/ML IV SOLN
INTRAVENOUS | Status: AC
  Filled 2015-01-07: qty 50

## 2015-01-07 MED ORDER — MIDAZOLAM HCL 2 MG/2ML IJ SOLN
INTRAMUSCULAR | Status: DC | PRN
  Administered 2015-01-07: 2 mg via INTRAVENOUS

## 2015-01-07 MED ORDER — DOCUSATE SODIUM 100 MG PO CAPS
200.0000 mg | ORAL_CAPSULE | Freq: Every day | ORAL | Status: DC
  Administered 2015-01-08 – 2015-01-10 (×3): 200 mg via ORAL
  Filled 2015-01-07 (×3): qty 2

## 2015-01-07 MED ORDER — DEXTROSE 5 % IV SOLN
1.5000 g | INTRAVENOUS | Status: DC
  Filled 2015-01-07: qty 1.5

## 2015-01-07 MED ORDER — MAGNESIUM SULFATE 50 % IJ SOLN
40.0000 meq | INTRAMUSCULAR | Status: DC
  Filled 2015-01-07: qty 10

## 2015-01-07 MED ORDER — PHENYLEPHRINE HCL 10 MG/ML IJ SOLN
30.0000 ug/min | INTRAVENOUS | Status: DC
  Filled 2015-01-07: qty 2

## 2015-01-07 MED ORDER — LACTATED RINGERS IV SOLN
INTRAVENOUS | Status: DC
  Administered 2015-01-07: 10 mL/h via INTRAVENOUS

## 2015-01-07 MED ORDER — THROMBIN 20000 UNITS EX SOLR
CUTANEOUS | Status: AC
  Filled 2015-01-07: qty 20000

## 2015-01-07 MED ORDER — INSULIN REGULAR BOLUS VIA INFUSION
0.0000 [IU] | Freq: Three times a day (TID) | INTRAVENOUS | Status: DC
  Administered 2015-01-08: 1.9 [IU] via INTRAVENOUS
  Filled 2015-01-07: qty 10

## 2015-01-07 MED ORDER — CEFUROXIME SODIUM 1.5 G IJ SOLR
1.5000 g | Freq: Two times a day (BID) | INTRAMUSCULAR | Status: AC
  Administered 2015-01-07 – 2015-01-09 (×4): 1.5 g via INTRAVENOUS
  Filled 2015-01-07 (×5): qty 1.5

## 2015-01-07 MED ORDER — ACETAMINOPHEN 500 MG PO TABS
1000.0000 mg | ORAL_TABLET | Freq: Four times a day (QID) | ORAL | Status: DC
  Administered 2015-01-08 – 2015-01-12 (×17): 1000 mg via ORAL
  Filled 2015-01-07 (×16): qty 2

## 2015-01-07 MED ORDER — LACTATED RINGERS IV SOLN
INTRAVENOUS | Status: DC | PRN
Start: 2015-01-07 — End: 2015-01-07
  Administered 2015-01-07 (×2): via INTRAVENOUS

## 2015-01-07 MED ORDER — ASPIRIN 81 MG PO CHEW
324.0000 mg | CHEWABLE_TABLET | Freq: Every day | ORAL | Status: DC
  Administered 2015-01-10 – 2015-01-11 (×2): 324 mg
  Filled 2015-01-07 (×2): qty 4

## 2015-01-07 MED ORDER — POTASSIUM CHLORIDE 10 MEQ/50ML IV SOLN
10.0000 meq | INTRAVENOUS | Status: AC
  Filled 2015-01-07: qty 50

## 2015-01-07 MED ORDER — NITROGLYCERIN IN D5W 200-5 MCG/ML-% IV SOLN
2.0000 ug/min | INTRAVENOUS | Status: AC
  Administered 2015-01-07: 10 ug/min via INTRAVENOUS

## 2015-01-07 MED ORDER — LIDOCAINE HCL (CARDIAC) 20 MG/ML IV SOLN
INTRAVENOUS | Status: AC
  Filled 2015-01-07: qty 5

## 2015-01-07 MED ORDER — PROTAMINE SULFATE 10 MG/ML IV SOLN
INTRAVENOUS | Status: AC
  Filled 2015-01-07: qty 5

## 2015-01-07 MED ORDER — SODIUM CHLORIDE 0.9 % IV SOLN
INTRAVENOUS | Status: DC
  Filled 2015-01-07: qty 30

## 2015-01-07 MED ORDER — INSULIN REGULAR HUMAN 100 UNIT/ML IJ SOLN
INTRAMUSCULAR | Status: AC
  Administered 2015-01-07: 1.6 [IU]/h via INTRAVENOUS
  Filled 2015-01-07: qty 2.5

## 2015-01-07 MED ORDER — SODIUM CHLORIDE 0.45 % IV SOLN
INTRAVENOUS | Status: DC | PRN
  Administered 2015-01-07: 20 mL/h via INTRAVENOUS

## 2015-01-07 MED ORDER — IOHEXOL 350 MG/ML SOLN
INTRAVENOUS | Status: DC | PRN
  Administered 2015-01-07: 70 mL via INTRA_ARTERIAL

## 2015-01-07 MED ORDER — LIDOCAINE HCL (PF) 1 % IJ SOLN
INTRAMUSCULAR | Status: AC
  Filled 2015-01-07: qty 30

## 2015-01-07 MED ORDER — SUCCINYLCHOLINE CHLORIDE 20 MG/ML IJ SOLN
INTRAMUSCULAR | Status: AC
  Filled 2015-01-07: qty 1

## 2015-01-07 MED ORDER — METOCLOPRAMIDE HCL 5 MG/ML IJ SOLN
10.0000 mg | Freq: Four times a day (QID) | INTRAMUSCULAR | Status: AC
  Administered 2015-01-07 – 2015-01-08 (×4): 10 mg via INTRAVENOUS
  Filled 2015-01-07 (×4): qty 2

## 2015-01-07 MED ORDER — NITROGLYCERIN 1 MG/10 ML FOR IR/CATH LAB
INTRA_ARTERIAL | Status: AC
  Filled 2015-01-07: qty 10

## 2015-01-07 MED ORDER — MIDAZOLAM HCL 5 MG/ML IJ SOLN
INTRAMUSCULAR | Status: DC | PRN
  Administered 2015-01-07: 4 mg via INTRAVENOUS
  Administered 2015-01-07 (×2): 2 mg via INTRAVENOUS

## 2015-01-07 MED ORDER — PLASMA-LYTE 148 IV SOLN
INTRAVENOUS | Status: AC
  Administered 2015-01-07: 500 mL
  Filled 2015-01-07: qty 2.5

## 2015-01-07 MED ORDER — SODIUM CHLORIDE 0.9 % IJ SOLN: 3.0000 mL | INTRAMUSCULAR | Status: DC | PRN

## 2015-01-07 MED ORDER — DEXTROSE 5 % IV SOLN
750.0000 mg | INTRAVENOUS | Status: DC
  Filled 2015-01-07: qty 750

## 2015-01-07 MED ORDER — PHENYLEPHRINE HCL 10 MG/ML IJ SOLN
30.0000 ug/min | INTRAVENOUS | Status: AC
  Administered 2015-01-07: 30 ug/min via INTRAVENOUS
  Filled 2015-01-07: qty 2

## 2015-01-07 MED ORDER — ACETAMINOPHEN 160 MG/5ML PO SOLN
1000.0000 mg | Freq: Four times a day (QID) | ORAL | Status: DC
  Filled 2015-01-07: qty 40

## 2015-01-07 MED ORDER — BISACODYL 5 MG PO TBEC
10.0000 mg | DELAYED_RELEASE_TABLET | Freq: Every day | ORAL | Status: DC
  Administered 2015-01-08 – 2015-01-09 (×2): 10 mg via ORAL
  Filled 2015-01-07 (×2): qty 2

## 2015-01-07 MED ORDER — VECURONIUM BROMIDE 10 MG IV SOLR
INTRAVENOUS | Status: AC
  Filled 2015-01-07: qty 10

## 2015-01-07 MED ORDER — DEXMEDETOMIDINE HCL IN NACL 400 MCG/100ML IV SOLN
0.1000 ug/kg/h | INTRAVENOUS | Status: AC
Start: 2015-01-07 — End: 2015-01-07
  Administered 2015-01-07: .2 ug/kg/h via INTRAVENOUS
  Filled 2015-01-07: qty 100

## 2015-01-07 MED ORDER — SODIUM CHLORIDE 0.9 % IV SOLN: INTRAVENOUS | Status: DC

## 2015-01-07 MED ORDER — VANCOMYCIN HCL 10 G IV SOLR
1250.0000 mg | INTRAVENOUS | Status: DC
  Filled 2015-01-07: qty 1250

## 2015-01-07 MED ORDER — CEFUROXIME SODIUM 1.5 G IJ SOLR
1.5000 g | INTRAMUSCULAR | Status: AC
  Administered 2015-01-07: .75 g via INTRAVENOUS
  Administered 2015-01-07: 1.5 g via INTRAVENOUS
  Filled 2015-01-07: qty 1.5

## 2015-01-07 MED ORDER — METOPROLOL TARTRATE 25 MG/10 ML ORAL SUSPENSION: 12.5000 mg | Freq: Two times a day (BID) | ORAL | Status: DC

## 2015-01-07 MED ORDER — MIDAZOLAM HCL 10 MG/2ML IJ SOLN
INTRAMUSCULAR | Status: AC
  Filled 2015-01-07: qty 2

## 2015-01-07 MED ORDER — VERAPAMIL HCL 2.5 MG/ML IV SOLN
INTRA_ARTERIAL | Status: DC | PRN
  Administered 2015-01-07: 10:00:00 via INTRA_ARTERIAL

## 2015-01-07 MED ORDER — SODIUM CHLORIDE 0.9 % IJ SOLN
INTRAMUSCULAR | Status: AC
  Filled 2015-01-07: qty 20

## 2015-01-07 MED ORDER — SUCCINYLCHOLINE CHLORIDE 20 MG/ML IJ SOLN
INTRAMUSCULAR | Status: DC | PRN
  Administered 2015-01-07: 100 mg via INTRAVENOUS

## 2015-01-07 MED ORDER — PLASMA-LYTE 148 IV SOLN
INTRAVENOUS | Status: DC
  Filled 2015-01-07: qty 2.5

## 2015-01-07 MED ORDER — FENTANYL CITRATE (PF) 100 MCG/2ML IJ SOLN
INTRAMUSCULAR | Status: DC | PRN
  Administered 2015-01-07: 50 ug via INTRAVENOUS

## 2015-01-07 MED ORDER — MIDAZOLAM HCL 2 MG/2ML IJ SOLN
INTRAMUSCULAR | Status: AC
  Filled 2015-01-07: qty 2

## 2015-01-07 MED ORDER — VANCOMYCIN HCL 10 G IV SOLR
1250.0000 mg | INTRAVENOUS | Status: AC
  Administered 2015-01-07: 1250 mg via INTRAVENOUS
  Filled 2015-01-07: qty 1250

## 2015-01-07 MED ORDER — ASPIRIN EC 325 MG PO TBEC
325.0000 mg | DELAYED_RELEASE_TABLET | Freq: Every day | ORAL | Status: DC
  Administered 2015-01-08 – 2015-01-09 (×2): 325 mg via ORAL
  Filled 2015-01-07 (×4): qty 1

## 2015-01-07 MED ORDER — VERAPAMIL HCL 2.5 MG/ML IV SOLN
INTRAVENOUS | Status: AC
  Filled 2015-01-07: qty 2

## 2015-01-07 MED ORDER — STERILE WATER FOR INJECTION IJ SOLN
INTRAMUSCULAR | Status: AC
  Filled 2015-01-07: qty 10

## 2015-01-07 MED ORDER — SODIUM CHLORIDE 0.9 % IJ SOLN
3.0000 mL | Freq: Two times a day (BID) | INTRAMUSCULAR | Status: DC
  Administered 2015-01-09 – 2015-01-11 (×5): 3 mL via INTRAVENOUS

## 2015-01-07 MED ORDER — FENTANYL CITRATE (PF) 250 MCG/5ML IJ SOLN
INTRAMUSCULAR | Status: AC
  Filled 2015-01-07: qty 5

## 2015-01-07 MED ORDER — FAMOTIDINE IN NACL 20-0.9 MG/50ML-% IV SOLN
20.0000 mg | Freq: Two times a day (BID) | INTRAVENOUS | Status: AC
  Administered 2015-01-07: 20 mg via INTRAVENOUS

## 2015-01-07 MED ORDER — FENTANYL CITRATE (PF) 250 MCG/5ML IJ SOLN
INTRAMUSCULAR | Status: AC
  Filled 2015-01-07: qty 20

## 2015-01-07 MED ORDER — HEPARIN SODIUM (PORCINE) 1000 UNIT/ML IJ SOLN
INTRAMUSCULAR | Status: AC
  Filled 2015-01-07: qty 1

## 2015-01-07 MED ORDER — CHLORHEXIDINE GLUCONATE 0.12 % MT SOLN
15.0000 mL | OROMUCOSAL | Status: AC
  Administered 2015-01-07: 15 mL via OROMUCOSAL
  Filled 2015-01-07: qty 15

## 2015-01-07 MED ORDER — PROTAMINE SULFATE 10 MG/ML IV SOLN
INTRAVENOUS | Status: DC | PRN
  Administered 2015-01-07: 20 mg via INTRAVENOUS

## 2015-01-07 MED ORDER — DEXMEDETOMIDINE HCL IN NACL 200 MCG/50ML IV SOLN: 0.0000 ug/kg/h | INTRAVENOUS | Status: DC

## 2015-01-07 MED ORDER — ACETAMINOPHEN 160 MG/5ML PO SOLN: 650.0000 mg | Freq: Once | ORAL | Status: AC

## 2015-01-07 MED ORDER — NITROGLYCERIN IN D5W 200-5 MCG/ML-% IV SOLN
0.0000 ug/min | INTRAVENOUS | Status: DC
  Filled 2015-01-07: qty 250

## 2015-01-07 MED ORDER — EPHEDRINE SULFATE 50 MG/ML IJ SOLN
INTRAMUSCULAR | Status: AC
  Filled 2015-01-07: qty 1

## 2015-01-07 MED ORDER — MAGNESIUM SULFATE 4 GM/100ML IV SOLN
4.0000 g | Freq: Once | INTRAVENOUS | Status: AC
  Administered 2015-01-07: 4 g via INTRAVENOUS
  Filled 2015-01-07: qty 100

## 2015-01-07 MED ORDER — HEPARIN (PORCINE) IN NACL 2-0.9 UNIT/ML-% IJ SOLN
INTRAMUSCULAR | Status: AC
  Filled 2015-01-07: qty 1000

## 2015-01-07 MED ORDER — MIDAZOLAM HCL 2 MG/2ML IJ SOLN
2.0000 mg | INTRAMUSCULAR | Status: DC | PRN
  Administered 2015-01-07: 2 mg via INTRAVENOUS
  Filled 2015-01-07: qty 2

## 2015-01-07 MED ORDER — PHENYLEPHRINE 40 MCG/ML (10ML) SYRINGE FOR IV PUSH (FOR BLOOD PRESSURE SUPPORT)
PREFILLED_SYRINGE | INTRAVENOUS | Status: AC
  Filled 2015-01-07: qty 10

## 2015-01-07 MED ORDER — VECURONIUM BROMIDE 10 MG IV SOLR
INTRAVENOUS | Status: DC | PRN
  Administered 2015-01-07: 3 mg via INTRAVENOUS
  Administered 2015-01-07: 5 mg via INTRAVENOUS

## 2015-01-07 MED ORDER — LACTATED RINGERS IV SOLN: 500.0000 mL | Freq: Once | INTRAVENOUS | Status: DC | PRN

## 2015-01-07 MED ORDER — VANCOMYCIN HCL IN DEXTROSE 1-5 GM/200ML-% IV SOLN
1000.0000 mg | Freq: Once | INTRAVENOUS | Status: AC
  Administered 2015-01-07: 1000 mg via INTRAVENOUS
  Filled 2015-01-07: qty 200

## 2015-01-07 MED ORDER — NITROGLYCERIN IN D5W 200-5 MCG/ML-% IV SOLN
2.0000 ug/min | INTRAVENOUS | Status: DC
  Filled 2015-01-07: qty 250

## 2015-01-07 MED ORDER — CALCIUM CHLORIDE 10 % IV SOLN
INTRAVENOUS | Status: DC | PRN
Start: 2015-01-07 — End: 2015-01-07
  Administered 2015-01-07 (×2): 500 mg via INTRAVENOUS

## 2015-01-07 MED ORDER — PHENYLEPHRINE HCL 10 MG/ML IJ SOLN
INTRAMUSCULAR | Status: AC
  Filled 2015-01-07: qty 1

## 2015-01-07 MED ORDER — TRAMADOL HCL 50 MG PO TABS
50.0000 mg | ORAL_TABLET | ORAL | Status: DC | PRN
  Administered 2015-01-08: 50 mg via ORAL
  Administered 2015-01-08 – 2015-01-10 (×2): 100 mg via ORAL
  Filled 2015-01-07 (×2): qty 2
  Filled 2015-01-07: qty 1

## 2015-01-07 MED ORDER — SODIUM CHLORIDE 0.9 % IV SOLN: 250.0000 mL | INTRAVENOUS | Status: DC

## 2015-01-07 MED ORDER — EPINEPHRINE HCL 1 MG/ML IJ SOLN
0.0000 ug/min | INTRAVENOUS | Status: DC
  Filled 2015-01-07: qty 4

## 2015-01-07 MED ORDER — PROPOFOL 10 MG/ML IV BOLUS
INTRAVENOUS | Status: DC | PRN
  Administered 2015-01-07: 60 mg via INTRAVENOUS

## 2015-01-07 MED ORDER — SODIUM CHLORIDE 0.9 % IV SOLN
INTRAVENOUS | Status: DC
  Administered 2015-01-07: 22:00:00 via INTRAVENOUS
  Filled 2015-01-07 (×2): qty 2.5

## 2015-01-07 MED ORDER — METOPROLOL TARTRATE 12.5 MG HALF TABLET
12.5000 mg | ORAL_TABLET | Freq: Two times a day (BID) | ORAL | Status: DC
  Administered 2015-01-08 – 2015-01-09 (×3): 12.5 mg via ORAL
  Filled 2015-01-07 (×3): qty 1

## 2015-01-07 MED ORDER — SODIUM CHLORIDE 0.9 % IV SOLN
INTRAVENOUS | Status: AC
  Administered 2015-01-07: 69.8 mL/h via INTRAVENOUS
  Filled 2015-01-07: qty 40

## 2015-01-07 MED ORDER — BISACODYL 10 MG RE SUPP: 10.0000 mg | Freq: Every day | RECTAL | Status: DC

## 2015-01-07 MED ORDER — SODIUM CHLORIDE 0.9 % IV SOLN
INTRAVENOUS | Status: DC
  Filled 2015-01-07: qty 2.5

## 2015-01-07 MED ORDER — DEXTROSE 5 % IV SOLN
0.0000 ug/min | INTRAVENOUS | Status: DC
  Filled 2015-01-07: qty 4

## 2015-01-07 MED ORDER — PANTOPRAZOLE SODIUM 40 MG PO TBEC
40.0000 mg | DELAYED_RELEASE_TABLET | Freq: Every day | ORAL | Status: DC
  Administered 2015-01-09 – 2015-01-11 (×3): 40 mg via ORAL
  Filled 2015-01-07 (×3): qty 1

## 2015-01-07 MED ORDER — ANTISEPTIC ORAL RINSE SOLUTION (CORINZ)
7.0000 mL | Freq: Four times a day (QID) | OROMUCOSAL | Status: DC
  Administered 2015-01-08: 7 mL via OROMUCOSAL

## 2015-01-07 MED ORDER — AMINOCAPROIC ACID 250 MG/ML IV SOLN
INTRAVENOUS | Status: DC
  Filled 2015-01-07: qty 40

## 2015-01-07 MED ORDER — PHENYLEPHRINE HCL 10 MG/ML IJ SOLN
0.0000 ug/min | INTRAVENOUS | Status: DC
  Administered 2015-01-07: 20 ug/min via INTRAVENOUS
  Filled 2015-01-07 (×2): qty 2

## 2015-01-07 MED ORDER — FENTANYL CITRATE (PF) 100 MCG/2ML IJ SOLN
INTRAMUSCULAR | Status: AC
  Filled 2015-01-07: qty 2

## 2015-01-07 MED ORDER — LIDOCAINE HCL (PF) 1 % IJ SOLN
INTRAMUSCULAR | Status: DC | PRN
  Administered 2015-01-07: 3 mL

## 2015-01-07 MED ORDER — HEPARIN SODIUM (PORCINE) 1000 UNIT/ML IJ SOLN
INTRAMUSCULAR | Status: DC | PRN
  Administered 2015-01-07: 5000 [IU] via INTRAVENOUS

## 2015-01-07 MED ORDER — HEPARIN SODIUM (PORCINE) 1000 UNIT/ML IJ SOLN
INTRAMUSCULAR | Status: DC | PRN
  Administered 2015-01-07: 28000 [IU] via INTRAVENOUS

## 2015-01-07 MED ORDER — ROCURONIUM BROMIDE 100 MG/10ML IV SOLN
INTRAVENOUS | Status: DC | PRN
  Administered 2015-01-07 (×2): 50 mg via INTRAVENOUS

## 2015-01-07 MED ORDER — THROMBIN 20000 UNITS EX SOLR
OROMUCOSAL | Status: DC | PRN
  Administered 2015-01-07: 4 mL via TOPICAL

## 2015-01-07 MED ORDER — GLYCOPYRROLATE 0.2 MG/ML IJ SOLN
INTRAMUSCULAR | Status: AC
  Filled 2015-01-07: qty 1

## 2015-01-07 MED ORDER — 0.9 % SODIUM CHLORIDE (POUR BTL) OPTIME
TOPICAL | Status: DC | PRN
  Administered 2015-01-07: 1000 mL

## 2015-01-07 MED ORDER — METOPROLOL TARTRATE 1 MG/ML IV SOLN
2.5000 mg | INTRAVENOUS | Status: DC | PRN
  Administered 2015-01-10: 2.5 mg via INTRAVENOUS
  Filled 2015-01-07: qty 5

## 2015-01-07 MED ORDER — LACTATED RINGERS IV SOLN
INTRAVENOUS | Status: DC
  Administered 2015-01-07: 20 mL/h via INTRAVENOUS

## 2015-01-07 MED ORDER — MORPHINE SULFATE (PF) 2 MG/ML IV SOLN
1.0000 mg | INTRAVENOUS | Status: AC | PRN
  Administered 2015-01-07: 2 mg via INTRAVENOUS

## 2015-01-07 MED ORDER — ROCURONIUM BROMIDE 50 MG/5ML IV SOLN
INTRAVENOUS | Status: AC
  Filled 2015-01-07: qty 2

## 2015-01-07 MED ORDER — OXYCODONE HCL 5 MG PO TABS
5.0000 mg | ORAL_TABLET | ORAL | Status: DC | PRN
  Administered 2015-01-08 (×2): 10 mg via ORAL
  Administered 2015-01-08: 5 mg via ORAL
  Administered 2015-01-08 – 2015-01-09 (×5): 10 mg via ORAL
  Administered 2015-01-10 – 2015-01-11 (×4): 5 mg via ORAL
  Filled 2015-01-07: qty 2
  Filled 2015-01-07 (×2): qty 1
  Filled 2015-01-07: qty 2
  Filled 2015-01-07 (×2): qty 1
  Filled 2015-01-07 (×2): qty 2
  Filled 2015-01-07: qty 1
  Filled 2015-01-07 (×3): qty 2

## 2015-01-07 MED FILL — Heparin Sodium (Porcine) 100 Unt/ML in Sodium Chloride 0.45%: INTRAMUSCULAR | Qty: 250 | Status: AC

## 2015-01-07 SURGICAL SUPPLY — 97 items
BAG DECANTER FOR FLEXI CONT (MISCELLANEOUS) ×2 IMPLANT
BANDAGE ACE 4X5 VEL STRL LF (GAUZE/BANDAGES/DRESSINGS) ×2 IMPLANT
BANDAGE ACE 6X5 VEL STRL LF (GAUZE/BANDAGES/DRESSINGS) ×2 IMPLANT
BANDAGE ELASTIC 4 VELCRO ST LF (GAUZE/BANDAGES/DRESSINGS) ×2 IMPLANT
BANDAGE ELASTIC 6 VELCRO ST LF (GAUZE/BANDAGES/DRESSINGS) ×2 IMPLANT
BASKET HEART (ORDER IN 25'S) (MISCELLANEOUS) ×1
BASKET HEART (ORDER IN 25S) (MISCELLANEOUS) ×1 IMPLANT
BLADE STERNUM SYSTEM 6 (BLADE) ×2 IMPLANT
BNDG GAUZE ELAST 4 BULKY (GAUZE/BANDAGES/DRESSINGS) ×2 IMPLANT
CANISTER SUCTION 2500CC (MISCELLANEOUS) ×2 IMPLANT
CATH ROBINSON RED A/P 18FR (CATHETERS) ×4 IMPLANT
CATH THORACIC 28FR (CATHETERS) ×2 IMPLANT
CATH THORACIC 36FR (CATHETERS) ×2 IMPLANT
CATH THORACIC 36FR RT ANG (CATHETERS) ×2 IMPLANT
CLIP TI MEDIUM 24 (CLIP) IMPLANT
CLIP TI WIDE RED SMALL 24 (CLIP) ×2 IMPLANT
COVER SURGICAL LIGHT HANDLE (MISCELLANEOUS) ×2 IMPLANT
CRADLE DONUT ADULT HEAD (MISCELLANEOUS) ×2 IMPLANT
DRAPE CARDIOVASCULAR INCISE (DRAPES) ×1
DRAPE SLUSH/WARMER DISC (DRAPES) ×2 IMPLANT
DRAPE SRG 135X102X78XABS (DRAPES) ×1 IMPLANT
DRSG COVADERM 4X14 (GAUZE/BANDAGES/DRESSINGS) ×2 IMPLANT
ELECT CAUTERY BLADE 6.4 (BLADE) ×2 IMPLANT
ELECT REM PT RETURN 9FT ADLT (ELECTROSURGICAL) ×4
ELECTRODE REM PT RTRN 9FT ADLT (ELECTROSURGICAL) ×2 IMPLANT
GAUZE SPONGE 4X4 12PLY STRL (GAUZE/BANDAGES/DRESSINGS) ×4 IMPLANT
GLOVE BIO SURGEON STRL SZ 6 (GLOVE) IMPLANT
GLOVE BIO SURGEON STRL SZ 6.5 (GLOVE) IMPLANT
GLOVE BIO SURGEON STRL SZ7 (GLOVE) IMPLANT
GLOVE BIO SURGEON STRL SZ7.5 (GLOVE) IMPLANT
GLOVE BIOGEL PI IND STRL 6 (GLOVE) IMPLANT
GLOVE BIOGEL PI IND STRL 6.5 (GLOVE) IMPLANT
GLOVE BIOGEL PI IND STRL 7.0 (GLOVE) IMPLANT
GLOVE BIOGEL PI INDICATOR 6 (GLOVE)
GLOVE BIOGEL PI INDICATOR 6.5 (GLOVE)
GLOVE BIOGEL PI INDICATOR 7.0 (GLOVE)
GLOVE EUDERMIC 7 POWDERFREE (GLOVE) ×4 IMPLANT
GLOVE ORTHO TXT STRL SZ7.5 (GLOVE) IMPLANT
GOWN STRL REUS W/ TWL LRG LVL3 (GOWN DISPOSABLE) ×4 IMPLANT
GOWN STRL REUS W/ TWL XL LVL3 (GOWN DISPOSABLE) ×1 IMPLANT
GOWN STRL REUS W/TWL LRG LVL3 (GOWN DISPOSABLE) ×4
GOWN STRL REUS W/TWL XL LVL3 (GOWN DISPOSABLE) ×1
HEMOSTAT POWDER SURGIFOAM 1G (HEMOSTASIS) ×6 IMPLANT
HEMOSTAT SURGICEL 2X14 (HEMOSTASIS) ×2 IMPLANT
INSERT FOGARTY 61MM (MISCELLANEOUS) IMPLANT
INSERT FOGARTY XLG (MISCELLANEOUS) IMPLANT
KIT BASIN OR (CUSTOM PROCEDURE TRAY) ×2 IMPLANT
KIT CATH CPB BARTLE (MISCELLANEOUS) ×2 IMPLANT
KIT ROOM TURNOVER OR (KITS) ×2 IMPLANT
KIT SUCTION CATH 14FR (SUCTIONS) ×2 IMPLANT
KIT VASOVIEW W/TROCAR VH 2000 (KITS) ×2 IMPLANT
NS IRRIG 1000ML POUR BTL (IV SOLUTION) ×10 IMPLANT
PACK OPEN HEART (CUSTOM PROCEDURE TRAY) ×2 IMPLANT
PAD ARMBOARD 7.5X6 YLW CONV (MISCELLANEOUS) ×4 IMPLANT
PAD ELECT DEFIB RADIOL ZOLL (MISCELLANEOUS) ×2 IMPLANT
PENCIL BUTTON HOLSTER BLD 10FT (ELECTRODE) ×2 IMPLANT
PUNCH AORTIC ROTATE 4.0MM (MISCELLANEOUS) IMPLANT
PUNCH AORTIC ROTATE 4.5MM 8IN (MISCELLANEOUS) ×2 IMPLANT
PUNCH AORTIC ROTATE 5MM 8IN (MISCELLANEOUS) IMPLANT
SET CARDIOPLEGIA MPS 5001102 (MISCELLANEOUS) ×2 IMPLANT
SPONGE GAUZE 4X4 12PLY STER LF (GAUZE/BANDAGES/DRESSINGS) ×2 IMPLANT
SPONGE INTESTINAL PEANUT (DISPOSABLE) IMPLANT
SPONGE LAP 18X18 X RAY DECT (DISPOSABLE) IMPLANT
SPONGE LAP 4X18 X RAY DECT (DISPOSABLE) ×2 IMPLANT
SUT BONE WAX W31G (SUTURE) ×2 IMPLANT
SUT MNCRL AB 4-0 PS2 18 (SUTURE) IMPLANT
SUT PROLENE 3 0 SH DA (SUTURE) IMPLANT
SUT PROLENE 3 0 SH1 36 (SUTURE) ×2 IMPLANT
SUT PROLENE 4 0 RB 1 (SUTURE)
SUT PROLENE 4 0 SH DA (SUTURE) IMPLANT
SUT PROLENE 4-0 RB1 .5 CRCL 36 (SUTURE) IMPLANT
SUT PROLENE 5 0 C 1 36 (SUTURE) IMPLANT
SUT PROLENE 6 0 C 1 30 (SUTURE) IMPLANT
SUT PROLENE 7 0 BV 1 (SUTURE) IMPLANT
SUT PROLENE 7 0 BV1 MDA (SUTURE) ×2 IMPLANT
SUT PROLENE 8 0 BV175 6 (SUTURE) IMPLANT
SUT SILK  1 MH (SUTURE)
SUT SILK 1 MH (SUTURE) IMPLANT
SUT STEEL STERNAL CCS#1 18IN (SUTURE) IMPLANT
SUT STEEL SZ 6 DBL 3X14 BALL (SUTURE) IMPLANT
SUT VIC AB 1 CTX 36 (SUTURE) ×2
SUT VIC AB 1 CTX36XBRD ANBCTR (SUTURE) ×2 IMPLANT
SUT VIC AB 2-0 CT1 27 (SUTURE) ×1
SUT VIC AB 2-0 CT1 TAPERPNT 27 (SUTURE) ×1 IMPLANT
SUT VIC AB 2-0 CTX 27 (SUTURE) IMPLANT
SUT VIC AB 3-0 SH 27 (SUTURE)
SUT VIC AB 3-0 SH 27X BRD (SUTURE) IMPLANT
SUT VIC AB 3-0 X1 27 (SUTURE) IMPLANT
SUT VICRYL 4-0 PS2 18IN ABS (SUTURE) ×2 IMPLANT
SUTURE E-PAK OPEN HEART (SUTURE) ×2 IMPLANT
SYSTEM SAHARA CHEST DRAIN ATS (WOUND CARE) ×2 IMPLANT
TOWEL OR 17X24 6PK STRL BLUE (TOWEL DISPOSABLE) ×2 IMPLANT
TOWEL OR 17X26 10 PK STRL BLUE (TOWEL DISPOSABLE) ×2 IMPLANT
TRAY FOLEY IC TEMP SENS 16FR (CATHETERS) ×2 IMPLANT
TUBING INSUFFLATION (TUBING) ×2 IMPLANT
UNDERPAD 30X30 INCONTINENT (UNDERPADS AND DIAPERS) ×2 IMPLANT
WATER STERILE IRR 1000ML POUR (IV SOLUTION) ×4 IMPLANT

## 2015-01-07 SURGICAL SUPPLY — 11 items
CATH INFINITI 5FR ANG PIGTAIL (CATHETERS) ×2 IMPLANT
CATH OPTITORQUE TIG 4.0 5F (CATHETERS) ×2 IMPLANT
DEVICE RAD COMP TR BAND LRG (VASCULAR PRODUCTS) ×2 IMPLANT
ELECT DEFIB PAD ADLT CADENCE (PAD) ×2 IMPLANT
GLIDESHEATH SLEND A-KIT 6F 22G (SHEATH) ×2 IMPLANT
KIT HEART LEFT (KITS) ×2 IMPLANT
PACK CARDIAC CATHETERIZATION (CUSTOM PROCEDURE TRAY) ×2 IMPLANT
SYR MEDRAD MARK V 150ML (SYRINGE) ×2 IMPLANT
TRANSDUCER W/STOPCOCK (MISCELLANEOUS) ×2 IMPLANT
TUBING CIL FLEX 10 FLL-RA (TUBING) ×2 IMPLANT
WIRE SAFE-T 1.5MM-J .035X260CM (WIRE) ×2 IMPLANT

## 2015-01-07 NOTE — Anesthesia Procedure Notes (Addendum)
Procedure Name: Intubation Date/Time: 01/07/2015 10:47 AM Performed by: Jerilee HohMUMM, VALERIE N Pre-anesthesia Checklist: Patient identified, Emergency Drugs available, Suction available and Patient being monitored Patient Re-evaluated:Patient Re-evaluated prior to inductionOxygen Delivery Method: Circle system utilized Preoxygenation: Pre-oxygenation with 100% oxygen Intubation Type: IV induction Ventilation: Mask ventilation without difficulty Laryngoscope Size: Mac and 4 Grade View: Grade II Tube type: Oral Tube size: 8.0 mm Number of attempts: 1 Airway Equipment and Method: Stylet Placement Confirmation: ETT inserted through vocal cords under direct vision,  positive ETCO2 and breath sounds checked- equal and bilateral Secured at: 22 cm Tube secured with: Tape Dental Injury: Teeth and Oropharynx as per pre-operative assessment    Central Venous Catheter Insertion Performed by: anesthesiologist Patient location: OR. Preanesthetic checklist: patient identified, IV checked, site marked, risks and benefits discussed, surgical consent, monitors and equipment checked, pre-op evaluation, timeout performed and anesthesia consent Position: supine PA cath was placed.Swan type and PA catheter depth:thermodilation and 50PA Cath depth:50 Procedure performed without using ultrasound guided technique. Attempts: 1 Patient tolerated the procedure well with no immediate complications.    Central Venous Catheter Insertion Performed by: anesthesiologist Patient location: OR. Preanesthetic checklist: patient identified, IV checked, site marked, risks and benefits discussed, surgical consent, monitors and equipment checked, pre-op evaluation, timeout performed and anesthesia consent Position: Trendelenburg Lidocaine 1% used for infiltration Landmarks identified Catheter size: 9 Fr Central line was placed.MAC introducer Procedure performed using ultrasound guided technique. Attempts: 1 Following  insertion, line sutured and dressing applied. Post procedure assessment: blood return through all ports, free fluid flow and no air. Patient tolerated the procedure well with no immediate complications.

## 2015-01-07 NOTE — Progress Notes (Signed)
      [address]       [place] [id number]             [phone]      Awake and following commands  Wean in progress  BP 99/79 mmHg  Pulse 89  Temp(Src) 97.2 F (36.2 C) (Oral)  Resp 19  Ht 6' (1.829 m)  Wt 183 lb 11.2 oz (83.326 kg)  BMI 24.91 kg/m2  SpO2 99%  Ci= 2.3   Intake/Output Summary (Last 24 hours) at 01/07/15 1808 Last data filed at 01/07/15 1700  Gross per 24 hour  Intake 2085.48 ml  Output   1070 ml  Net 1015.48 ml    Doing well early postop  Viviann SpareSteven C. Dorris FetchHendrickson, MD [facility] [phone]

## 2015-01-07 NOTE — Progress Notes (Signed)
Pt heparin gtt stopped per protocol; pt picked up and transported off to cath lab for procedure. Dionne BucyP. Amo Astraea Gaughran RN

## 2015-01-07 NOTE — Brief Op Note (Signed)
01/06/2015 - 01/07/2015  1:37 PM  PATIENT:  Logan Reynolds  49 y.o. male  PRE-OPERATIVE DIAGNOSIS:  non-ST elevation myocardial infarction, left main artery disease  POST-OPERATIVE DIAGNOSIS:  non-ST elevation myocardial infarction, left main artery disease  PROCEDURE:  Procedure(s):  CORONARY ARTERY BYPASS GRAFTING x 4 -LIMA to LAD -SVG to OM1 -SEQ SVG to DIAG 1 and DIAG 2  ENDOSCOPIC HARVEST GREATER SAPHENOUS VEIN -Right Thigh  SURGEON:  Surgeon(s) and Role:    * Alleen BorneBryan K Bartle, MD - Primary  PHYSICIAN ASSISTANT: Lowella DandyErin Tijah Hane PA-C  ANESTHESIA:   general  EBL:  Total I/O In: 510.4 [P.O.:100; I.V.:410.4] Out: 400 [Urine:400]  BLOOD ADMINISTERED:CELLSAVER  DRAINS: Left Pleural Chest Tubes, Mediastinal Chest Drains   LOCAL MEDICATIONS USED:  NONE  SPECIMEN:  No Specimen  DISPOSITION OF SPECIMEN:  N/A  COUNTS:  YES  TOURNIQUET:  * No tourniquets in log *  DICTATION: .Dragon Dictation  PLAN OF CARE: Admit to inpatient   PATIENT DISPOSITION:  ICU - intubated and hemodynamically stable.   Delay start of Pharmacological VTE agent (>24hrs) due to surgical blood loss or risk of bleeding: yes

## 2015-01-07 NOTE — Op Note (Signed)
CARDIOVASCULAR SURGERY OPERATIVE NOTE  01/07/2015  Surgeon:  Alleen BorneBryan K. Bartle, MD  First Assistant: Lowella DandyErin Barrett,  PA-C   Preoperative Diagnosis:  Severe left main and LAD coronary artery disease, s/p NSTEMI   Postoperative Diagnosis:  Same   Procedure: Emergency from Logan cath lab  1. Median Sternotomy 2. Extracorporeal circulation 3.   Coronary artery bypass grafting x 4   Left internal mammary graft to Logan LAD  Sequential SVG to D1  Sequential SVG to D2  SVG to OM 4.   Endoscopic vein harvest from Logan right leg   Anesthesia:  General Endotracheal   Clinical History/Surgical Indication:  Logan Reynolds is a 49 year old smoker with hypertension, DM on metformin, reflux and anxiety on Xanax who has had intermittent chest pain over Logan past few weeks according to his wife and then developed a severe episode of left chest pressure that woke him up at 1:45 am and radiated down his left arm. He was short of breath and diaphoretic and [facility] ST depression that resolved with NTG and heparin. His first troponin was normal but second was 0.1. [facility]. Cath this am shows 90% distal LM, 100% proximal to mid LAD with 99% lesion in two diagonals. Large OM system without disease and RCA without disease. Mild LV dysfunction. He has high grade left main and LAD system disease presenting with NSTEMI. I agree with Logan need for emergent CABG. I discussed Logan operative procedure with Logan Reynolds and his wife including alternatives, benefits and risks; including but not limited to bleeding, blood transfusion, infection, stroke, myocardial infarction, graft failure, heart block requiring a permanent pacemaker, organ dysfunction, and death. Logan Reynolds understands and agrees to proceed.   Preparation:  Logan Reynolds was seen in Logan  preoperative holding area and Logan correct Reynolds, correct operation were confirmed with Logan Reynolds after reviewing Logan medical record and catheterization. Logan consent was signed by me. Preoperative antibiotics were given. A pulmonary arterial line and radial arterial line were placed by Logan anesthesia team. Logan Reynolds was taken back to Logan operating room and positioned supine on Logan operating room table. After being placed under general endotracheal anesthesia by Logan anesthesia team a foley catheter was placed. Logan neck, chest, abdomen, and both legs were prepped with betadine soap and solution and draped in Logan usual sterile manner. A surgical time-out was taken and Logan correct Reynolds and operative procedure were confirmed with Logan nursing and anesthesia staff.   Cardiopulmonary Bypass:  A median sternotomy was performed. Logan pericardium was opened in Logan midline. Right ventricular function appeared normal. Logan ascending aorta was of normal size and had no palpable plaque. There were no contraindications to aortic cannulation or cross-clamping. Logan Reynolds was fully systemically heparinized and Logan ACT was maintained > 400 sec. Logan proximal aortic arch was cannulated with a 20 F aortic cannula for arterial inflow. Venous cannulation was performed via Logan right atrial appendage using a two-staged venous cannula. An antegrade cardioplegia/vent cannula was inserted into Logan mid-ascending aorta. Aortic occlusion was performed with a single cross-clamp. Systemic cooling to 32 degrees Centigrade and topical cooling of Logan heart with iced saline were used. Hyperkalemic antegrade cold blood cardioplegia was used to induce diastolic arrest and was then given at about 20 minute intervals throughout Logan period of arrest to maintain myocardial temperature at or below 10 degrees centigrade. A temperature probe was inserted into Logan interventricular septum and  an insulating pad was placed in Logan pericardium.   Left  internal mammary harvest:  Logan left side of Logan sternum was retracted using Logan Rultract retractor. Logan left internal mammary artery was harvested as a pedicle graft. All side branches were clipped. It was a medium-sized vessel of good quality with excellent blood flow. It was ligated distally and divided. It was sprayed with topical papaverine solution to prevent vasospasm.   Endoscopic vein harvest:  Logan right greater saphenous vein was harvested endoscopically through a 2 cm incision medial to Logan right knee. It was harvested from Logan upper thigh to below Logan knee. It was a medium-sized vein of good quality. Logan side branches were all ligated with 4-0 silk ties.    Coronary arteries:  Logan coronary arteries were examined.   LAD:  Intramyocardial throughout Logan proximal and mid portion. Logan distal vessel is small but graftable with no disease. D1 and D2 are medium sized vessels with no distal disease.  LCX:  Large OM with a couple other small OM branches that all communicate.  RCA:  No disease on cath.   Grafts:  1. LIMA to Logan LAD: 1.6 mm distally. It was sewn end to side using 8-0 prolene continuous suture. 2. Sequential SVG to D1:  1.6 mm. It was sewn sequential side to side using 7-0 prolene continuous suture. 3. Sequential SVG to D2:  1.6 mm. It was sewn sequential end to side using 7-0 prolene continuous suture. 4. SVG to major OM:  1.75 mm. It was sewn end to side using 7-0 prolene continuous suture.  Logan proximal vein graft anastomoses were performed to Logan mid-ascending aorta using continuous 6-0 prolene suture. Graft markers were placed around Logan proximal anastomoses.   Completion:  Logan Reynolds was rewarmed to 37 degrees Centigrade. Logan clamp was removed from Logan LIMA pedicle and there was rapid warming of Logan septum and no spontaneous rhythm.  Logan crossclamp was removed with a time of 65 minutes. There was no spontaneous return of  rhythm. Logan distal and proximal  anastomoses were checked for hemostasis. Logan position of Logan grafts was satisfactory. Two temporary epicardial pacing wires were placed on Logan right atrium and two on Logan right ventricle. He was paced AV sequential. Logan Reynolds was weaned from CPB without difficulty on no inotropes. CPB time was 94 minutes. Cardiac output was 5 LPM. TEE showed good LV function with some septal hypokinesis. Heparin was fully reversed with protamine and Logan aortic and venous cannulas removed. Hemostasis was achieved. Mediastinal and left pleural drainage tubes were placed. Logan sternum was closed with double #6 stainless steel wires. Logan fascia was closed with continuous # 1 vicryl suture. Logan subcutaneous tissue was closed with 2-0 vicryl continuous suture. Logan skin was closed with 3-0 vicryl subcuticular suture. All sponge, needle, and instrument counts were reported correct at Logan end of Logan case. Dry sterile dressings were placed over Logan incisions and around Logan chest tubes which were connected to pleurevac suction. Logan Reynolds was then transported to Logan surgical intensive care unit in critical but stable condition.

## 2015-01-07 NOTE — Transfer of Care (Signed)
Immediate Anesthesia Transfer of Care Note  Patient: Logan Reynolds  Procedure(s) Performed: Procedure(s): CORONARY ARTERY BYPASS GRAFTING (CABG)x 4 using left internal mammary artery and right saphenous leg vein. (N/A)  Patient Location: ICU  Anesthesia Type:General  Level of Consciousness: sedated and Patient remains intubated per anesthesia plan  Airway & Oxygen Therapy: Patient remains intubated per anesthesia plan and Patient placed on Ventilator (see vital sign flow sheet for setting)  Post-op Assessment: Report given to RN and Post -op Vital signs reviewed and stable  Post vital signs: Reviewed and stable  Last Vitals:  Filed Vitals:   01/07/15 1007 01/07/15 1012  BP: 114/73 112/69  Pulse: 55 53  Temp:    Resp: 20 19    Complications: No apparent anesthesia complications

## 2015-01-07 NOTE — Procedures (Signed)
Extubation Procedure Note  Patient Details:   Name: Cammie McgeeDavid Ray Channing DOB: 12/01/1965 MRN: [id number]   Airway Documentation:     Evaluation  O2 sats: stable throughout Complications: No apparent complications Patient did tolerate procedure well. Bilateral Breath Sounds: Diminished, Coarse crackles   Yes  4l/min [place] IS instructed NIF--30/FVC 1.1L  Newt LukesGroendal, Marea Reasner Ann 01/07/2015, 7:08 PM

## 2015-01-07 NOTE — Consult Note (Signed)
[address]       [place] [address]             [phone]      Cardiothoracic Surgery Consultation  Reason for Consult: High grade left main and LAD coronary artery disease s/p NSTEMI Referring Physician: Dr. Glenetta Hew  Logan Reynolds is an 49 y.o. male.  HPI:   The patient is a 49 year old smoker with hypertension, DM on metformin, reflux and anxiety on Xanax who has had intermittent chest pain over the past few weeks according to his wife and then developed a severe episode of left chest pressure that woke him up at 1:45 am and radiated down his left arm. He was short of breath and diaphoretic and went to [facility] where he had inferior ST depression that resolved with NTG and heparin. His first troponin was normal but second was 0.1. He was transferred to [facility] yesterday afternoon. Cath this am shows 90% distal LM, 100% proximal to mid LAD with 99% lesion in two diagonals. Large OM system without disease and RCA without disease. Mild LV dysfunction  Past Medical History  Diagnosis Date  . HTN (hypertension)   . Tobacco abuse   . Diabetes mellitus, type 2 ([place])   . GERD (gastroesophageal reflux disease)   . Anxiety     Past Surgical History  Procedure Laterality Date  . Knee arthroscopy    . Cardiac catheterization  2006    Family History  Problem Relation Age of Onset  . Cancer Mother     ovarian  . Cancer Father     bone cancer  . Diabetes Maternal Grandmother   . Diabetes Brother   . Heart attack Father     CABG x5V    Social History:  reports that he has been smoking Cigarettes.  He has a 37.5 pack-year smoking history. He has never used smokeless tobacco. He reports that he does not drink alcohol or use illicit drugs. Lives with his wife. Works as an Clinical biochemist.  Allergies: No Known Allergies  Medications:  I have reviewed the patient's current medications. Prior to Admission:  Prescriptions prior to admission  Medication Sig  Dispense Refill Last Dose  . ALPRAZolam (XANAX) 0.5 MG tablet Take 0.5 mg by mouth 3 (three) times daily as needed for anxiety.   01/05/2015 at Unknown time  . metFORMIN (GLUCOPHAGE) 500 MG tablet Take 500 mg by mouth 2 (two) times daily with a meal.   01/05/2015 at Unknown time  . omeprazole (PRILOSEC) 20 MG capsule Take 20 mg by mouth daily.   01/05/2015 at Unknown time   Scheduled: . aminocaproic acid (AMICAR) for OHS   Intravenous To OR  . [MAR Hold] aspirin EC  81 mg Oral Daily  . [MAR Hold] atorvastatin  80 mg Oral q1800  . [MAR Hold] carvedilol  3.125 mg Oral BID WC  . cefUROXime (ZINACEF)  IV  1.5 g Intravenous To OR  . cefUROXime (ZINACEF)  IV  750 mg Intravenous To OR  . dexmedetomidine  0.1-0.7 mcg/kg/hr Intravenous To OR  . DOPamine  0-10 mcg/kg/min Intravenous To OR  . epinephrine  0-10 mcg/min Intravenous To OR  . heparin-papaverine-plasmalyte irrigation   Irrigation To OR  . heparin 30,000 units/NS 1000 mL solution for CELLSAVER   Other To OR  . [MAR Hold] insulin aspart  0-15 Units Subcutaneous TID WC  . insulin (NOVOLIN-R) infusion   Intravenous To OR  . magnesium  sulfate  40 mEq Other To OR  . nitroGLYCERIN  2-200 mcg/min Intravenous To OR  . [MAR Hold] pantoprazole  40 mg Oral Daily  . phenylephrine (NEO-SYNEPHRINE) Adult infusion  30-200 mcg/min Intravenous To OR  . potassium chloride  80 mEq Other To OR  . [MAR Hold] sodium chloride  3 mL Intravenous Q12H  . sodium chloride  3 mL Intravenous Q12H  . vancomycin  1,250 mg Intravenous To OR   Continuous: . sodium chloride 1 mL/kg/hr (01/07/15 0715)  . heparin Stopped (01/07/15 0837)   PRN:[MAR Hold] sodium chloride, sodium chloride, [MAR Hold] acetaminophen, [MAR Hold] ALPRAZolam, [MAR Hold] nitroGLYCERIN, [MAR Hold] ondansetron (ZOFRAN) IV, [MAR Hold] sodium chloride, sodium chloride Anti-infectives    Start     Dose/Rate Route Frequency Ordered Stop   01/07/15 1015  vancomycin (VANCOCIN) 1,250 mg in sodium  chloride 0.9 % 250 mL IVPB     1,250 mg 166.7 mL/hr over 90 Minutes Intravenous To Surgery 01/07/15 1010 01/08/15 1015   01/07/15 1015  cefUROXime (ZINACEF) 1.5 g in dextrose 5 % 50 mL IVPB     1.5 g 100 mL/hr over 30 Minutes Intravenous To Surgery 01/07/15 1010 01/08/15 1015   01/07/15 1015  cefUROXime (ZINACEF) 750 mg in dextrose 5 % 50 mL IVPB     750 mg 100 mL/hr over 30 Minutes Intravenous To Surgery 01/07/15 1010 01/08/15 1015      Results for orders placed or performed during the hospital encounter of 01/06/15 (from the past 48 hour(s))  Protime-INR     Status: None   Collection Time: 01/06/15  1:30 PM  Result Value Ref Range   Prothrombin Time 13.5 11.6 - 15.2 seconds   INR 1.01 0.00 - 1.49  Troponin I     Status: Abnormal   Collection Time: 01/06/15  1:30 PM  Result Value Ref Range   Troponin I 1.74 (HH) <0.031 ng/mL    Comment:        POSSIBLE MYOCARDIAL ISCHEMIA. SERIAL TESTING RECOMMENDED. CRITICAL RESULT CALLED TO, READ BACK BY AND VERIFIED WITH: CLOER,K RN @ 1452 01/06/15 LEONARD,A   Hemoglobin A1c     Status: Abnormal   Collection Time: 01/06/15  1:30 PM  Result Value Ref Range   Hgb A1c MFr Bld 6.6 (H) 4.8 - 5.6 %    Comment: (NOTE)         Pre-diabetes: 5.7 - 6.4         Diabetes: >6.4         Glycemic control for adults with diabetes: <7.0    Mean Plasma Glucose 143 mg/dL    Comment: (NOTE) Performed At: [facility] [place], [place] [id number] Lindon Romp MD SU:[id number]   TSH     Status: None   Collection Time: 01/06/15  1:30 PM  Result Value Ref Range   TSH 0.816 0.350 - 4.500 uIU/mL  Magnesium     Status: None   Collection Time: 01/06/15  1:30 PM  Result Value Ref Range   Magnesium 2.0 1.7 - 2.4 mg/dL  Heparin level (unfractionated)     Status: Abnormal   Collection Time: 01/06/15  1:30 PM  Result Value Ref Range   Heparin Unfractionated 0.19 (L) 0.30 - 0.70 IU/mL    Comment:        IF HEPARIN RESULTS ARE  BELOW EXPECTED VALUES, AND PATIENT DOSAGE HAS BEEN CONFIRMED, SUGGEST FOLLOW UP TESTING OF ANTITHROMBIN III LEVELS.   Glucose, capillary  Status: Abnormal   Collection Time: 01/06/15  1:37 PM  Result Value Ref Range   Glucose-Capillary 100 (H) 65 - 99 mg/dL  Glucose, capillary     Status: Abnormal   Collection Time: 01/06/15  4:32 PM  Result Value Ref Range   Glucose-Capillary 126 (H) 65 - 99 mg/dL   Comment 1 Notify RN    Comment 2 Document in Chart   Troponin I     Status: Abnormal   Collection Time: 01/06/15  6:08 PM  Result Value Ref Range   Troponin I 1.22 (HH) <0.031 ng/mL    Comment:        POSSIBLE MYOCARDIAL ISCHEMIA. SERIAL TESTING RECOMMENDED. CRITICAL VALUE NOTED.  VALUE IS CONSISTENT WITH PREVIOUSLY REPORTED AND CALLED VALUE.   Glucose, capillary     Status: Abnormal   Collection Time: 01/06/15  9:46 PM  Result Value Ref Range   Glucose-Capillary 215 (H) 65 - 99 mg/dL   Comment 1 Notify RN    Comment 2 Document in Chart   Heparin level (unfractionated)     Status: None   Collection Time: 01/06/15  9:53 PM  Result Value Ref Range   Heparin Unfractionated 0.51 0.30 - 0.70 IU/mL    Comment:        IF HEPARIN RESULTS ARE BELOW EXPECTED VALUES, AND PATIENT DOSAGE HAS BEEN CONFIRMED, SUGGEST FOLLOW UP TESTING OF ANTITHROMBIN III LEVELS.   Troponin I     Status: Abnormal   Collection Time: 01/07/15 12:46 AM  Result Value Ref Range   Troponin I 0.94 (HH) <0.031 ng/mL    Comment:        POSSIBLE MYOCARDIAL ISCHEMIA. SERIAL TESTING RECOMMENDED. CRITICAL VALUE NOTED.  VALUE IS CONSISTENT WITH PREVIOUSLY REPORTED AND CALLED VALUE.   Comprehensive metabolic panel     Status: Abnormal   Collection Time: 01/07/15 12:46 AM  Result Value Ref Range   Sodium 138 135 - 145 mmol/L   Potassium 4.0 3.5 - 5.1 mmol/L   Chloride 104 101 - 111 mmol/L   CO2 24 22 - 32 mmol/L   Glucose, Bld 196 (H) 65 - 99 mg/dL   BUN 16 6 - 20 mg/dL   Creatinine, Ser 1.10 0.61 - 1.24  mg/dL   Calcium 9.2 8.9 - 10.3 mg/dL   Total Protein 6.6 6.5 - 8.1 g/dL   Albumin 3.9 3.5 - 5.0 g/dL   AST 24 15 - 41 U/L   ALT 22 17 - 63 U/L   Alkaline Phosphatase 62 38 - 126 U/L   Total Bilirubin 0.4 0.3 - 1.2 mg/dL   GFR calc non Af Amer >60 >60 mL/min   GFR calc Af Amer >60 >60 mL/min    Comment: (NOTE) The eGFR has been calculated using the CKD EPI equation. This calculation has not been validated in all clinical situations. eGFR's persistently <60 mL/min signify possible Chronic Kidney Disease.    Anion gap 10 5 - 15  Lipid panel     Status: Abnormal   Collection Time: 01/07/15 12:46 AM  Result Value Ref Range   Cholesterol 160 0 - 200 mg/dL   Triglycerides 146 <150 mg/dL   HDL 37 (L) >40 mg/dL   Total CHOL/HDL Ratio 4.3 RATIO   VLDL 29 0 - 40 mg/dL   LDL Cholesterol 94 0 - 99 mg/dL    Comment:        Total Cholesterol/HDL:CHD Risk Coronary Heart Disease Risk Table  Men   Women  1/2 Average Risk   3.4   3.3  Average Risk       5.0   4.4  2 X Average Risk   9.6   7.1  3 X Average Risk  23.4   11.0        Use the calculated Patient Ratio above and the CHD Risk Table to determine the patient's CHD Risk.        ATP III CLASSIFICATION (LDL):  <100     mg/dL   Optimal  100-129  mg/dL   Near or Above                    Optimal  130-159  mg/dL   Borderline  160-189  mg/dL   High  >190     mg/dL   Very High   CBC     Status: None   Collection Time: 01/07/15 12:46 AM  Result Value Ref Range   WBC 8.8 4.0 - 10.5 K/uL   RBC 5.46 4.22 - 5.81 MIL/uL   Hemoglobin 16.2 13.0 - 17.0 g/dL   HCT 49.1 39.0 - 52.0 %   MCV 89.9 78.0 - 100.0 fL   MCH 29.7 26.0 - 34.0 pg   MCHC 33.0 30.0 - 36.0 g/dL   RDW 14.4 11.5 - 15.5 %   Platelets 227 150 - 400 K/uL  Protime-INR     Status: None   Collection Time: 01/07/15 12:46 AM  Result Value Ref Range   Prothrombin Time 13.8 11.6 - 15.2 seconds   INR 1.04 0.00 - 1.49  Heparin level (unfractionated)      Status: None   Collection Time: 01/07/15 12:46 AM  Result Value Ref Range   Heparin Unfractionated 0.37 0.30 - 0.70 IU/mL    Comment:        IF HEPARIN RESULTS ARE BELOW EXPECTED VALUES, AND PATIENT DOSAGE HAS BEEN CONFIRMED, SUGGEST FOLLOW UP TESTING OF ANTITHROMBIN III LEVELS.   Glucose, capillary     Status: Abnormal   Collection Time: 01/07/15  6:46 AM  Result Value Ref Range   Glucose-Capillary 136 (H) 65 - 99 mg/dL    No results found.  Review of Systems  Constitutional: Positive for malaise/fatigue and diaphoresis.  HENT: Negative.   Eyes: Negative.   Respiratory: Positive for shortness of breath. Negative for cough and sputum production.   Cardiovascular: Positive for chest pain. Negative for palpitations, orthopnea, claudication, leg swelling and PND.  Gastrointestinal: Positive for heartburn.  Genitourinary: Negative.   Musculoskeletal: Negative.   Skin: Negative.   Neurological: Negative.   Endo/Heme/Allergies: Negative.   Psychiatric/Behavioral: The patient is nervous/anxious.    Blood pressure 112/69, pulse 53, temperature 98.4 F (36.9 C), temperature source Oral, resp. rate 19, height 6' (1.829 m), weight 83.326 kg (183 lb 11.2 oz), SpO2 97 %. Physical Exam  Constitutional: He is oriented to person, place, and time. He appears well-developed and well-nourished. No distress.  HENT:  Head: Normocephalic and atraumatic.  Eyes: EOM are normal. Pupils are equal, round, and reactive to light.  Neck:  No bruits  Cardiovascular: Normal rate, regular rhythm and intact distal pulses.   No murmur heard. Respiratory: Effort normal and breath sounds normal. No respiratory distress.  GI: Soft. Bowel sounds are normal. He exhibits no distension and no mass. There is no tenderness.  Musculoskeletal: Normal range of motion. He exhibits no edema.  Neurological: He is alert and oriented to person, place, and time. He  has normal strength. No cranial nerve deficit or sensory  deficit.  Skin: Skin is warm and dry.  Psychiatric: He has a normal mood and affect.    Leonie Man, MD (Primary)      Procedures    Left Heart Cath and Coronary Angiography    Conclusion     Severe left main and LAD disease.  LM lesion, 90% stenosed.  Prox LAD to Mid LAD lesion, 100% stenosed.  Ost 2nd Diag to 2nd Diag lesion, 99% stenosed.  There is mild left ventricular systolic dysfunction.   49 year old gentleman with diabetes and hypertension as well as tobacco abuse who now presents with non-STEMI is found to have severe Left Main as well as LAD disease. Based on the severity of his disease, the patient's best options are to proceed urgently/emergently to the OR for CABG.  The patient's films reviewed with Dr. Cyndia Bent who agrees. He is talkative the patient and the plan is for him to go from the calf up to the OR.  Further management post CABG. He will need continued risk factor modification    HARDING, Leonie Green, M.D., M.S. Interventional Cardiologist   Pager # [phone]      Indications    NSTEMI (non-ST elevated myocardial infarction) ([place]) [I21.4 (ICD-10-CM)]    Technique and Indications    Admit date: 01/06/2015   Primary Physician: No primary care provider on file. Primary Cardiologist: New- Dr Quentin Cornwall Antoinette Haskett is a 49 y.o. male with a history of HTN, DMT2, tobacco abuse, non obst CAD ( by cath in 2006 per patient report), GERD and anxiety who was transferred from [facility] on 01/06/2015 for a NSTEMI. He is now referred for cardiac catheterization.  PROCEDURE  Estimated blood loss <50 mL. There were no immediate complications during the procedure.  Time Out: Verified patient identification, verified procedure, site/side was marked, verified correct patient position, special equipment/implants available, medications/allergies/relevent history reviewed, required imaging and test results available. Performed. Consent  Signed.  The patient was sedated with 50 g IV fentanyl, 2 mgIV Versed  Access:  RIGHT Radial Artery: 6 Fr sheath -- Seldinger technique using Angiocath Micropuncture Kit 10 mL radial cocktail IA; 5000 Units IV Heparin  Left Heart Catheterization: 5Fr Catheters advanced or exchanged over a J-wire under direct fluoroscopic guidance into the ascending aorta; TIG 4.0 catheter advanced first.  Left Coronary Artery Cineangiography: TIG 4.0 Catheter  Right Coronary Artery Cineangiography: TIG 4.0 Catheter  LV Hemodynamics (LV Gram): Angled Pigtail Catheter  RADIAL Sheath(s) sutured in place with plan to be used for arterial pressure monitoring.    Coronary Findings    Dominance: Right   Left Main  . Vessel is large.   . LM lesion, 90% stenosed. The lesion is type C Discrete located proximal to major branch.     Left Anterior Descending   . Prox LAD to Mid LAD lesion, 100% stenosed. Diffuse eccentric located at the major branch. Subtotal occlusion Very faint distal flow - < TIMI 1. Does not reach the distal vessel   . First Diagonal Branch   The vessel is moderate in size. Moderate to large   . First Septal Branch   The vessel is small in size.   Marland Kitchen Second Diagonal Branch   The vessel is moderate in size.   Colon Flattery 2nd Diag to 2nd Diag lesion, 99% stenosed. Discrete located at the major branch.   . Second Septal Branch   The vessel is small in size.   Marland Kitchen  Third Septal Branch   The vessel is small in size.     Left Circumflex  Short ectatic segment in proximal vessel, otherwise minimal disease   . First Obtuse Marginal Branch   The vessel is small in size.     Right Coronary Artery  Vessel was injected. Vessel is large. Vessel is angiographically normal. TIG 4.0 Catheter used.   . Right Posterior Descending Artery   The vessel is moderate in size. Moderate to Large   . Right Posterior Atrioventricular Branch   The vessel is moderate in size.   . First Right Posterolateral    The vessel is small in size.   Marland Kitchen Second Right Posterolateral   The vessel is small in size.      Wall Motion                 Left Heart    Left Ventricle The left ventricular size is normal. There is mild left ventricular systolic dysfunction. The left ventricular ejection fraction is 45-50% by visual estimate. There are wall motion abnormalities in the left ventricle. There are segmental wall motion abnormalities in the left ventricle.   Mitral Valve There is no mitral valve stenosis and no mitral valve regurgitation.   Aortic Valve There is no aortic valve stenosis, and no aortic valve regurgitation.    Coronary Diagrams    Diagnostic Diagram            Implants    Name ID Temporary Type Supply   No information to display    PACS Images    Show images for Cardiac catheterization     Link to Procedure Log    Procedure Log      Hemo Data       Most Recent Value   AO Systolic Pressure  76 mmHg   AO Diastolic Pressure  49 mmHg   AO Mean  61 mmHg   LV Systolic Pressure  97 mmHg   LV Diastolic Pressure  2 mmHg   LV EDP  9 mmHg   Arterial Occlusion Pressure Extended Systolic Pressure  91 mmHg   Arterial Occlusion Pressure Extended Diastolic Pressure  49 mmHg   Arterial Occlusion Pressure Extended Mean Pressure  66 mmHg   Left Ventricular Apex Extended Systolic Pressure  91 mmHg   Left Ventricular Apex Extended Diastolic Pressure  1 mmHg   Left Ventricular Apex Extended EDP Pressure  9 mmHg      Assessment/Plan:  He has high grade left main and LAD system disease presenting with NSTEMI. I agree with the need for emergent CABG. I discussed the operative procedure with the patient and his wife including alternatives, benefits and risks; including but not limited to bleeding, blood transfusion, infection, stroke, myocardial infarction, graft failure, heart block requiring a permanent pacemaker, organ dysfunction, and death.  Arlyce Dice understands and agrees to proceed.    Fernande Boyden Celestine Bougie 01/07/2015, 10:20 AM

## 2015-01-07 NOTE — Anesthesia Preprocedure Evaluation (Signed)
Anesthesia Evaluation  Patient identified by MRN, date of birth, ID band Patient awake    Reviewed: Allergy & Precautions, Patient's Chart, lab work & pertinent test results  History of Anesthesia Complications Negative for: history of anesthetic complications  Airway Mallampati: II  TM Distance: >3 FB Neck ROM: Full    Dental  (+) Teeth Intact,    Pulmonary neg shortness of breath, neg sleep apnea, neg COPD, neg recent URI, Current Smoker, neg PE   breath sounds clear to auscultation- rhonchi       Cardiovascular hypertension, Pt. on medications + angina + CAD and + Past MI   Rhythm:Regular     Neuro/Psych PSYCHIATRIC DISORDERS Anxiety    GI/Hepatic GERD  Medicated,  Endo/Other  diabetes  Renal/GU      Musculoskeletal   Abdominal   Peds  Hematology negative hematology ROS (+)   Anesthesia Other Findings   Reproductive/Obstetrics                             Anesthesia Physical Anesthesia Plan  ASA: IV and emergent  Anesthesia Plan: General   Post-op Pain Management:    Induction: Intravenous  Airway Management Planned: Oral ETT  Additional Equipment: Arterial line, TEE, CVP, PA Cath and Ultrasound Guidance Line Placement  Intra-op Plan:   Post-operative Plan: Post-operative intubation/ventilation  Informed Consent: I have reviewed the patients History and Physical, chart, labs and discussed the procedure including the risks, benefits and alternatives for the proposed anesthesia with the patient or authorized representative who has indicated his/her understanding and acceptance.   Dental advisory given  Plan Discussed with: CRNA and Surgeon  Anesthesia Plan Comments:         Anesthesia Quick Evaluation

## 2015-01-07 NOTE — Interval H&P Note (Signed)
History and Physical Interval Note:  01/07/2015 9:29 AM  Cammie Mcgeeavid Ray Pinkham  has presented today for surgery, with the diagnosis of non stemi.    The various methods of treatment have been discussed with the patient and family. After consideration of risks, benefits and other options for treatment, the patient has consented to  Procedure(s): Left Heart Cath and Coronary Angiography (N/A) as a surgical intervention .  The patient's history has been reviewed, patient examined, no change in status, stable for surgery.  I have reviewed the patient's chart and labs.  Questions were answered to the patient's satisfaction.    Cath Lab Visit (complete for each Cath Lab visit)  Clinical Evaluation Leading to the Procedure:   ACS: Yes.    Non-ACS:    Anginal Classification: CCS IV  Anti-ischemic medical therapy: Minimal Therapy (1 class of medications)  Non-Invasive Test Results: No non-invasive testing performed  Prior CABG: No previous CABG    HARDING, Piedad ClimesAVID W, M.D., M.S. Interventional Cardiologist   Pager # [phone]      [facility] Medical CenterARDING, Naftali W

## 2015-01-08 ENCOUNTER — Inpatient Hospital Stay (HOSPITAL_COMMUNITY): Payer: BLUE CROSS/BLUE SHIELD

## 2015-01-08 ENCOUNTER — Encounter (HOSPITAL_COMMUNITY): Payer: Self-pay | Admitting: Surgery

## 2015-01-08 LAB — POCT I-STAT 3, ART BLOOD GAS (G3+)
ACID-BASE EXCESS: 9 mmol/L — AB (ref 0.0–2.0)
Acid-base deficit: 1 mmol/L (ref 0.0–2.0)
BICARBONATE: 36.4 meq/L — AB (ref 20.0–24.0)
BICARBONATE: 25.3 meq/L — AB (ref 20.0–24.0)
O2 Saturation: 100 %
O2 Saturation: 99 %
PCO2 ART: 47.1 mmHg — AB (ref 35.0–45.0)
PH ART: 7.337 — AB (ref 7.350–7.450)
PO2 ART: 615 mmHg — AB (ref 80.0–100.0)
TCO2: 38 mmol/L (ref 0–100)
TCO2: 27 mmol/L (ref 0–100)
pCO2 arterial: 69.1 mmHg (ref 35.0–45.0)
pH, Arterial: 7.329 — ABNORMAL LOW (ref 7.350–7.450)
pO2, Arterial: 179 mmHg — ABNORMAL HIGH (ref 80.0–100.0)

## 2015-01-08 LAB — POCT I-STAT, CHEM 8
BUN: 14 mg/dL (ref 6–20)
BUN: 10 mg/dL (ref 6–20)
BUN: 13 mg/dL (ref 6–20)
BUN: 13 mg/dL (ref 6–20)
BUN: 13 mg/dL (ref 6–20)
BUN: 12 mg/dL (ref 6–20)
CALCIUM ION: 1.2 mmol/L (ref 1.12–1.23)
CHLORIDE: 103 mmol/L (ref 101–111)
CHLORIDE: 103 mmol/L (ref 101–111)
CHLORIDE: 103 mmol/L (ref 101–111)
CHLORIDE: 96 mmol/L — AB (ref 101–111)
CREATININE: 0.5 mg/dL — AB (ref 0.61–1.24)
CREATININE: 0.5 mg/dL — AB (ref 0.61–1.24)
CREATININE: 0.6 mg/dL — AB (ref 0.61–1.24)
Calcium, Ion: 1.23 mmol/L (ref 1.12–1.23)
Calcium, Ion: 0.86 mmol/L — ABNORMAL LOW (ref 1.12–1.23)
Calcium, Ion: 0.98 mmol/L — ABNORMAL LOW (ref 1.12–1.23)
Calcium, Ion: 1.03 mmol/L — ABNORMAL LOW (ref 1.12–1.23)
Calcium, Ion: 1.36 mmol/L — ABNORMAL HIGH (ref 1.12–1.23)
Chloride: 105 mmol/L (ref 101–111)
Chloride: 107 mmol/L (ref 101–111)
Creatinine, Ser: 0.6 mg/dL — ABNORMAL LOW (ref 0.61–1.24)
Creatinine, Ser: 0.8 mg/dL (ref 0.61–1.24)
Creatinine, Ser: 0.9 mg/dL (ref 0.61–1.24)
GLUCOSE: 106 mg/dL — AB (ref 65–99)
Glucose, Bld: 127 mg/dL — ABNORMAL HIGH (ref 65–99)
Glucose, Bld: 101 mg/dL — ABNORMAL HIGH (ref 65–99)
Glucose, Bld: 106 mg/dL — ABNORMAL HIGH (ref 65–99)
Glucose, Bld: 130 mg/dL — ABNORMAL HIGH (ref 65–99)
Glucose, Bld: 129 mg/dL — ABNORMAL HIGH (ref 65–99)
HCT: 36 % — ABNORMAL LOW (ref 39.0–52.0)
HCT: 47 % (ref 39.0–52.0)
HEMATOCRIT: 45 % (ref 39.0–52.0)
HEMATOCRIT: 32 % — AB (ref 39.0–52.0)
HEMATOCRIT: 34 % — AB (ref 39.0–52.0)
HEMATOCRIT: 36 % — AB (ref 39.0–52.0)
HEMOGLOBIN: 10.9 g/dL — AB (ref 13.0–17.0)
HEMOGLOBIN: 12.2 g/dL — AB (ref 13.0–17.0)
HEMOGLOBIN: 12.2 g/dL — AB (ref 13.0–17.0)
HEMOGLOBIN: 16 g/dL (ref 13.0–17.0)
Hemoglobin: 15.3 g/dL (ref 13.0–17.0)
Hemoglobin: 11.6 g/dL — ABNORMAL LOW (ref 13.0–17.0)
POTASSIUM: 4.7 mmol/L (ref 3.5–5.1)
POTASSIUM: 4.4 mmol/L (ref 3.5–5.1)
POTASSIUM: 5.5 mmol/L — AB (ref 3.5–5.1)
POTASSIUM: 4.5 mmol/L (ref 3.5–5.1)
POTASSIUM: 4.2 mmol/L (ref 3.5–5.1)
Potassium: 5.6 mmol/L — ABNORMAL HIGH (ref 3.5–5.1)
SODIUM: 139 mmol/L (ref 135–145)
SODIUM: 138 mmol/L (ref 135–145)
SODIUM: 135 mmol/L (ref 135–145)
Sodium: 138 mmol/L (ref 135–145)
Sodium: 141 mmol/L (ref 135–145)
Sodium: 136 mmol/L (ref 135–145)
TCO2: 29 mmol/L (ref 0–100)
TCO2: 29 mmol/L (ref 0–100)
TCO2: 39 mmol/L (ref 0–100)
TCO2: 27 mmol/L (ref 0–100)
TCO2: 26 mmol/L (ref 0–100)
TCO2: 26 mmol/L (ref 0–100)

## 2015-01-08 LAB — CBC
HEMATOCRIT: 42.1 % (ref 39.0–52.0)
HEMATOCRIT: 44 % (ref 39.0–52.0)
HEMOGLOBIN: 13.7 g/dL (ref 13.0–17.0)
Hemoglobin: 14.8 g/dL (ref 13.0–17.0)
MCH: 29.3 pg (ref 26.0–34.0)
MCH: 30 pg (ref 26.0–34.0)
MCHC: 32.5 g/dL (ref 30.0–36.0)
MCHC: 33.6 g/dL (ref 30.0–36.0)
MCV: 90 fL (ref 78.0–100.0)
MCV: 89.2 fL (ref 78.0–100.0)
Platelets: 165 10*3/uL (ref 150–400)
Platelets: 145 10*3/uL — ABNORMAL LOW (ref 150–400)
RBC: 4.68 MIL/uL (ref 4.22–5.81)
RBC: 4.93 MIL/uL (ref 4.22–5.81)
RDW: 14.2 % (ref 11.5–15.5)
RDW: 14.3 % (ref 11.5–15.5)
WBC: 13.5 10*3/uL — AB (ref 4.0–10.5)
WBC: 12.7 10*3/uL — ABNORMAL HIGH (ref 4.0–10.5)

## 2015-01-08 LAB — GLUCOSE, CAPILLARY
GLUCOSE-CAPILLARY: 101 mg/dL — AB (ref 65–99)
GLUCOSE-CAPILLARY: 102 mg/dL — AB (ref 65–99)
GLUCOSE-CAPILLARY: 107 mg/dL — AB (ref 65–99)
GLUCOSE-CAPILLARY: 108 mg/dL — AB (ref 65–99)
GLUCOSE-CAPILLARY: 109 mg/dL — AB (ref 65–99)
GLUCOSE-CAPILLARY: 109 mg/dL — AB (ref 65–99)
GLUCOSE-CAPILLARY: 113 mg/dL — AB (ref 65–99)
GLUCOSE-CAPILLARY: 126 mg/dL — AB (ref 65–99)
GLUCOSE-CAPILLARY: 136 mg/dL — AB (ref 65–99)
GLUCOSE-CAPILLARY: 140 mg/dL — AB (ref 65–99)
GLUCOSE-CAPILLARY: 162 mg/dL — AB (ref 65–99)
Glucose-Capillary: 108 mg/dL — ABNORMAL HIGH (ref 65–99)
Glucose-Capillary: 70 mg/dL (ref 65–99)
Glucose-Capillary: 93 mg/dL (ref 65–99)
Glucose-Capillary: 107 mg/dL — ABNORMAL HIGH (ref 65–99)
Glucose-Capillary: 119 mg/dL — ABNORMAL HIGH (ref 65–99)
Glucose-Capillary: 127 mg/dL — ABNORMAL HIGH (ref 65–99)
Glucose-Capillary: 170 mg/dL — ABNORMAL HIGH (ref 65–99)
Glucose-Capillary: 118 mg/dL — ABNORMAL HIGH (ref 65–99)
Glucose-Capillary: 103 mg/dL — ABNORMAL HIGH (ref 65–99)
Glucose-Capillary: 138 mg/dL — ABNORMAL HIGH (ref 65–99)

## 2015-01-08 LAB — MAGNESIUM
MAGNESIUM: 2.1 mg/dL (ref 1.7–2.4)
Magnesium: 2.1 mg/dL (ref 1.7–2.4)

## 2015-01-08 LAB — CREATININE, SERUM
Creatinine, Ser: 0.9 mg/dL (ref 0.61–1.24)
GFR calc Af Amer: >60 mL/min (ref >60)
GFR calc non Af Amer: >60 mL/min (ref >60)

## 2015-01-08 LAB — BASIC METABOLIC PANEL
ANION GAP: 6 (ref 5–15)
BUN: 11 mg/dL (ref 6–20)
CALCIUM: 8.2 mg/dL — AB (ref 8.9–10.3)
CHLORIDE: 107 mmol/L (ref 101–111)
CO2: 24 mmol/L (ref 22–32)
Creatinine, Ser: 0.79 mg/dL (ref 0.61–1.24)
GFR calc non Af Amer: >60 mL/min (ref >60)
Glucose, Bld: 122 mg/dL — ABNORMAL HIGH (ref 65–99)
Potassium: 5 mmol/L (ref 3.5–5.1)
Sodium: 137 mmol/L (ref 135–145)

## 2015-01-08 LAB — HEMOGLOBIN A1C
HEMOGLOBIN A1C: 6.5 % — AB (ref 4.8–5.6)
MEAN PLASMA GLUCOSE: 140 mg/dL

## 2015-01-08 MED ORDER — ENOXAPARIN SODIUM 40 MG/0.4ML ~~LOC~~ SOLN
40.0000 mg | Freq: Every day | SUBCUTANEOUS | Status: DC
  Administered 2015-01-08 – 2015-01-11 (×4): 40 mg via SUBCUTANEOUS
  Filled 2015-01-08 (×4): qty 0.4

## 2015-01-08 MED ORDER — FUROSEMIDE 10 MG/ML IJ SOLN
40.0000 mg | Freq: Once | INTRAMUSCULAR | Status: AC
  Administered 2015-01-08: 40 mg via INTRAVENOUS
  Filled 2015-01-08: qty 4

## 2015-01-08 MED ORDER — ALPRAZOLAM 0.5 MG PO TABS
0.5000 mg | ORAL_TABLET | Freq: Three times a day (TID) | ORAL | Status: DC | PRN
  Administered 2015-01-08 – 2015-01-12 (×8): 0.5 mg via ORAL
  Filled 2015-01-08 (×9): qty 1

## 2015-01-08 MED ORDER — KETOROLAC TROMETHAMINE 15 MG/ML IJ SOLN
15.0000 mg | Freq: Four times a day (QID) | INTRAMUSCULAR | Status: AC | PRN
  Administered 2015-01-08 – 2015-01-09 (×4): 15 mg via INTRAVENOUS
  Filled 2015-01-08 (×4): qty 1

## 2015-01-08 MED ORDER — INSULIN ASPART 100 UNIT/ML ~~LOC~~ SOLN
0.0000 [IU] | SUBCUTANEOUS | Status: DC
  Administered 2015-01-08 – 2015-01-09 (×2): 2 [IU] via SUBCUTANEOUS

## 2015-01-08 MED FILL — Mannitol IV Soln 20%: INTRAVENOUS | Qty: 500 | Status: AC

## 2015-01-08 MED FILL — Sodium Chloride IV Soln 0.9%: INTRAVENOUS | Qty: 2000 | Status: AC

## 2015-01-08 MED FILL — Heparin Sodium (Porcine) Inj 1000 Unit/ML: INTRAMUSCULAR | Qty: 10 | Status: AC

## 2015-01-08 MED FILL — Heparin Sodium (Porcine) 2 Unit/ML in Sodium Chloride 0.9%: INTRAMUSCULAR | Qty: 500 | Status: AC

## 2015-01-08 MED FILL — Sodium Bicarbonate IV Soln 8.4%: INTRAVENOUS | Qty: 50 | Status: AC

## 2015-01-08 MED FILL — Lidocaine HCl IV Inj 20 MG/ML: INTRAVENOUS | Qty: 5 | Status: AC

## 2015-01-08 MED FILL — Electrolyte-R (PH 7.4) Solution: INTRAVENOUS | Qty: 3000 | Status: AC

## 2015-01-08 NOTE — Progress Notes (Signed)
1 Day Post-Op Procedure(s) (LRB): CORONARY ARTERY BYPASS GRAFTING (CABG)x 4 using left internal mammary artery and right saphenous leg vein. (N/A) Subjective:  Sore but otherwise ok  Objective: Vital signs in last 24 hours: Temp:  [95.2 F (35.1 C)-99.7 F (37.6 C)] 99.5 F (37.5 C) (12/30 0830) Pulse Rate:  [53-94] 89 (12/30 0830) Cardiac Rhythm:  [-] Normal sinus rhythm (12/30 0830) Resp:  [8-26] 12 (12/30 0830) BP: (92-128)/(54-79) 110/68 mmHg (12/30 0830) SpO2:  [89 %-100 %] 93 % (12/30 0830) Arterial Line BP: (72-128)/(48-102) 97/52 mmHg (12/30 0530) FiO2 (%):  [40 %-50 %] 40 % (12/29 1843) Weight:  [87 kg (191 lb 12.8 oz)] 87 kg (191 lb 12.8 oz) (12/30 0500)  Hemodynamic parameters for last 24 hours: PAP: (16-37)/(5-20) 25/7 mmHg CO:  [4.4 L/min-5.4 L/min] 4.9 L/min CI:  [2.1 L/min/m2-2.7 L/min/m2] 2.4 L/min/m2  Intake/Output from previous day: 12/29 0701 - 12/30 0700 In: 3612.4 [P.O.:100; I.V.:1572.4; Blood:750; NG/GT:20; IV Piggyback:1170] Out: 3260 [Urine:2730; Chest Tube:530] Intake/Output this shift: Total I/O In: 60 [I.V.:60] Out: 70 [Urine:60; Chest Tube:10]  General appearance: alert and cooperative Neurologic: intact Heart: regular rate and rhythm, S1, S2 normal, no murmur, click, rub or gallop Lungs: clear to auscultation bilaterally Extremities: edema mild Wound: dressings dry  Lab Results:  Recent Labs  01/07/15 2115 01/08/15 0356  WBC 14.5* 13.5*  HGB 14.1  14.6 13.7  HCT 42.1  43.0 42.1  PLT 135* 165   BMET:  Recent Labs  01/07/15 1000  01/07/15 2115 01/08/15 0356  NA 139  < > 140 137  K 4.1  < > 4.7 5.0  CL 107  --  107 107  CO2 24  --   --  24  GLUCOSE 130*  < > 116* 122*  BUN 13  --  12 11  CREATININE 0.91  --  0.79  0.70 0.79  CALCIUM 8.4*  --   --  8.2*  < > = values in this interval not displayed.  PT/INR:  Recent Labs  01/07/15 1545  LABPROT 16.4*  INR 1.31   ABG    Component Value Date/Time   PHART 7.340*  01/07/2015 2022   HCO3 23.8 01/07/2015 2022   TCO2 23 01/07/2015 2115   ACIDBASEDEF 2.0 01/07/2015 2022   O2SAT 95.0 01/07/2015 2022   CBG (last 3)   Recent Labs  01/08/15 0613 01/08/15 0731 01/08/15 0839  GLUCAP 127* 119* 140*    Assessment/Plan: S/P Procedure(s) (LRB): CORONARY ARTERY BYPASS GRAFTING (CABG)x 4 using left internal mammary artery and right saphenous leg vein. (N/A) Mobilize Diuresis Diabetes control: on Metformin preop with Hgb A1c of 6.5. Continue SSI and resume Metformin when eating well d/c tubes/lines Continue foley due to diuresing patient and patient in ICU See progression orders   LOS: 2 days    Alleen BorneBryan K Jevaun Strick 01/08/2015

## 2015-01-08 NOTE — Discharge Summary (Signed)
Physician Discharge Summary  Patient ID: Logan Reynolds MRN: [id number] DOB/AGE: 1965-08-15 49 y.o.  Admit date: 01/06/2015 Discharge date: 01/12/2015  Admission Diagnosis: NSTEMI  Discharge Diagnoses:  Active Problems:   HTN (hypertension)   Tobacco abuse   Diabetes mellitus, type 2 (HCC)   GERD (gastroesophageal reflux disease)   Anxiety   NSTEMI (non-ST elevated myocardial infarction) (HCC)   S/P CABG x 4  Patient Active Problem List   Diagnosis Date Noted  . S/P CABG x 4 01/07/2015  . NSTEMI (non-ST elevated myocardial infarction) (HCC) 01/06/2015  . HTN (hypertension)   . Tobacco abuse   . Diabetes mellitus, type 2 (HCC)   . GERD (gastroesophageal reflux disease)   . Anxiety   Brief post op a flutter  HPI: at time of consultation  The patient is a 49 year old smoker with hypertension, DM on metformin, reflux and anxiety on Xanax who has had intermittent chest pain over the past few weeks according to his wife and then developed a severe episode of left chest pressure that woke him up at 1:45 am and radiated down his left arm. He was short of breath and diaphoretic and went to [facility] where he had inferior ST depression that resolved with NTG and heparin. His first troponin was normal but second was 0.1. He was transferred to [facility] yesterday afternoon. Cath this am shows 90% distal LM, 100% proximal to mid LAD with 99% lesion in two diagonals. Large OM system without disease and RCA without disease. Mild LV dysfunction   Discharged Condition: good  Hospital Course: Patient was transferred from [facility] under the care of the cardiologist. He was chest pain-free at the time. He was taken for cardiac catheterization and found to have severe left main and multivessel coronary artery disease. Due to these findings emergent cardiothoracic surgical consultation was obtained with Evelene Croon M.D. who evaluated the patient and his studies and agree with recommendations to  proceed with emergent coronary artery surgical revascularization. He underwent the below described procedure, tolerated well and was taken to the surgical intensive care unit in stable condition. Postoperatively he was weaned from the ventilator without difficulty using standard protocols. All routine lines, monitors and drainage devices have been discontinued in the standard fashion. He does have some postoperative volume overload but is responding to diuretics. His blood sugars are under adequate control using standard protocols. His hemoglobin and hematocrit are within normal range. Incisions are noted to be healing well without evidence of infection. He went into a flutter with RVR on the evening of 01/10/2015. He was given IV Lopressor and he converted to sinus rhythm. He was then put on oral Amiodarone. He is ambulating on room air. Epicardial pacing wires were removed 01/10/2015. Chest tube sutures will be removed in the office after discharge. He has remained in sinus rhythm and is surgically stable for discharge today.  Consults: cardiology and cardiothoracic surgery  Significant Diagnostic Studies: labs: routine post-op and angiography: cardiac cath  Treatments: surgery:   CARDIOVASCULAR SURGERY OPERATIVE NOTE  01/07/2015  Surgeon: Alleen Borne, MD  First Assistant: Lowella Dandy, PA-C   Preoperative Diagnosis: Severe left main and LAD coronary artery disease, s/p NSTEMI   Postoperative Diagnosis: Same   Procedure: Emergency from the cath lab  1. Median Sternotomy 2. Extracorporeal circulation 3. Coronary artery bypass grafting x 4   Left internal mammary graft to the LAD  Sequential SVG to D1  Sequential SVG to D2  SVG to OM 4.  Endoscopic vein harvest from the right leg   Anesthesia: General Endotracheal   Discharge Exam: Blood pressure 99/66, pulse 80, temperature 98.7 F  (37.1 C), temperature source Oral, resp. rate 18, height 6' (1.829 m), weight 176 lb 3.2 oz (79.924 kg), SpO2 92 %.  Cardiovascular: RRR Pulmonary: Clear to auscultation bilaterally; no rales, wheezes, or rhonchi. Abdomen: Soft, non tender, bowel sounds present. Extremities: Trace bilateral lower extremity edema. Wounds: Clean and dry. No erythema or signs of infection.  Disposition:  discharge home  Discharge Instructions    Amb Referral to Cardiac Rehabilitation    Complete by:  As directed   Diagnosis:   CABG Myocardial Infarction             Medications at time of discharge:   Follow-up Information    Follow up with Alleen BorneBryan K Bartle, MD.   Specialty:  Cardiothoracic Surgery   Why:  02/11/2015 at 10 AM to see the surgeon. Please obtain a chest x-ray at 9:30 AM and [facility]. [place] [facility] is located in the same office complex.   Contact information:   [address] [place] [address] [phone]       Follow up with [facility] [place].   Specialty:  Cardiothoracic Surgery   Why:  Appointment with the nurse for suture removal on 01/15/2015 at 10 AM   Contact information:   [address] [place] [id number] [phone]      Follow up with Janetta HoraHOMPSON, KATHRYN R, PA-C On 02/03/2015.   Specialties:  Cardiology, Radiology   Why:  Appointment time is at 9:30 am   Contact information:   [address] [place] [address] [phone]      The patient has been discharged on:   1.Beta Blocker:  Yes [  x ]                              No   [   ]                              If No, reason:  2.Ace Inhibitor/ARB: Yes [   ]                                     No  [  x  ]                                     If No, reason:Labile BP  3.Statin:   Yes [  x ]                  No  [   ]                  If No, reason:  4.Ecasa:  Yes  [ x ]                  No    [   ]                  If No, reason:  Signed: Meshia Rau M PA-C 01/12/2015, 7:38 AM

## 2015-01-08 NOTE — Discharge Instructions (Signed)
Endoscopic Saphenous Vein Harvesting, Care After °Refer to this sheet in the next few weeks. These instructions provide you with information on caring for yourself after your procedure. Your health care provider may also give you more specific instructions. Your treatment has been planned according to current medical practices, but problems sometimes occur. Call your health care provider if you have any problems or questions after your procedure. °HOME CARE INSTRUCTIONS °Medicine °· Take whatever pain medicine your surgeon prescribes. Follow the directions carefully. Do not take over-the-counter pain medicine unless your surgeon says it is okay. Some pain medicine can cause bleeding problems for several weeks after surgery. °· Follow your surgeon's instructions about driving. You will probably not be permitted to drive after heart surgery. °· Take any medicines your surgeon prescribes. Any medicines you took before your heart surgery should be checked with your health care provider before you start taking them again. °Wound care °· If your surgeon has prescribed an elastic bandage or stocking, ask how long you should wear it. °· Check the area around your surgical cuts (incisions) whenever your bandages (dressings) are changed. Look for any redness or swelling. °· You will need to return to have the stitches (sutures) or staples taken out. Ask your surgeon when to do that. °· Ask your surgeon when you can shower or bathe. °Activity °· Try to keep your legs raised when you are sitting. °· Do any exercises your health care providers have given you. These may include deep breathing exercises, coughing, walking, or other exercises. °SEEK MEDICAL CARE IF: °· You have any questions about your medicines. °· You have more leg pain, especially if your pain medicine stops working. °· New or growing bruises develop on your leg. °· Your leg swells, feels tight, or becomes red. °· You have numbness in your leg. °SEEK IMMEDIATE  MEDICAL CARE IF: °· Your pain gets much worse. °· Blood or fluid leaks from any of the incisions. °· Your incisions become warm, swollen, or red. °· You have chest pain. °· You have trouble breathing. °· You have a fever. °· You have more pain near your leg incision. °MAKE SURE YOU: °· Understand these instructions. °· Will watch your condition. °· Will get help right away if you are not doing well or get worse. °  °This information is not intended to replace advice given to you by your health care provider. Make sure you discuss any questions you have with your health care provider. °  °Document Released: 09/07/2010 Document Revised: 01/16/2014 Document Reviewed: 09/07/2010 °Elsevier Interactive Patient Education ©2016 [employer] Inc[employer] °Coronary Artery Bypass Grafting, Care After °These instructions give you information on caring for yourself after your procedure. Your doctor may also give you more specific instructions. Call your doctor if you have any problems or questions after your procedure.  °HOME CARE °· Only take medicine as told by your doctor. Take medicines exactly as told. Do not stop taking medicines or start any new medicines without talking to your doctor first. °· Take your pulse as told by your doctor. °· Do deep breathing as told by your doctor. Use your breathing device (incentive spirometer), if given, to practice deep breathing several times a day. Support your chest with a pillow or your arms when you take deep breaths or cough. °· Keep the area clean, dry, and protected where the surgery cuts (incisions) were made. Remove bandages (dressings) only as told by your doctor. If strips were applied to surgical area, do not take   them off. They fall off on their own. °· Check the surgery area daily for puffiness (swelling), redness, or leaking fluid. °· If surgery cuts were made in your legs: °· Avoid crossing your legs. °· Avoid sitting for long periods of time. Change positions every 30  minutes. °· Raise your legs when you are sitting. Place them on pillows. °· Wear stockings that help keep blood clots from forming in your legs (compression stockings). °· Only take sponge baths until your doctor says it is okay to take showers. Pat the surgery area dry. Do not rub the surgery area with a washcloth or towel. Do not bathe, swim, or use a hot tub until your doctor says it is okay. °· Eat foods that are high in fiber. These include raw fruits and vegetables, whole grains, beans, and nuts. Choose lean meats. Avoid canned, processed, and fried foods. °· Drink enough fluids to keep your pee (urine) clear or pale yellow. °· Weigh yourself every day. °· Rest and limit activity as told by your doctor. You may be told to: °· Stop any activity if you have chest pain, shortness of breath, changes in heartbeat, or dizziness. Get help right away if this happens. °· Move around often for short amounts of time or take short walks as told by your doctor. Gradually become more active. You may need help to strengthen your muscles and build endurance. °· Avoid lifting, pushing, or pulling anything heavier than 10 pounds (4.5 kg) for at least 6 weeks after surgery. °· Do not drive until your doctor says it is okay. °· Ask your doctor when you can go back to work. °· Ask your doctor when you can begin sexual activity again. °· Follow up with your doctor as told. °GET HELP IF: °· You have puffiness, redness, more pain, or fluid draining from the incision site. °· You have a fever. °· You have puffiness in your ankles or legs. °· You have pain in your legs. °· You gain 2 or more pounds (0.9 kg) a day. °· You feel sick to your stomach (nauseous) or throw up (vomit). °· You have watery poop (diarrhea). °GET HELP RIGHT AWAY IF: °· You have chest pain that goes to your jaw or arms. °· You have shortness of breath. °· You have a fast or irregular heartbeat. °· You notice a "clicking" in your breastbone when you move. °· You  have numbness or weakness in your arms or legs. °· You feel dizzy or light-headed. °MAKE SURE YOU: °· Understand these instructions. °· Will watch your condition. °· Will get help right away if you are not doing well or get worse. °  °This information is not intended to replace advice given to you by your health care provider. Make sure you discuss any questions you have with your health care provider. °  °Document Released: 12/31/2012 Document Reviewed: 12/31/2012 °Elsevier Interactive Patient Education ©2016 Elsevier Inc. ° °

## 2015-01-08 NOTE — Progress Notes (Signed)
Mr. Benna DunksBarber complaining of pain in right forearm below radial sheath. Arm is tight, but soft to touch, same as left arm. No red streaking, or discoloration noted. Sheath is transduced- line pulsatile, and draws back blood. States that his thumb is tingling (started around 0430.) Dr. Dorris FetchHendrickson made aware. Order received to have cath lab staff remove sheath. Will continue to closely monitor. Thresa RossA. Haggard RN

## 2015-01-08 NOTE — Progress Notes (Signed)
Pt complaining of right forearm arm pain. Right radial sheath removed and TR Band placed with 9 cc's or air placed at 0545. Radial pleth and cap refill noted and good at this time. Pt given instructions.

## 2015-01-09 ENCOUNTER — Inpatient Hospital Stay (HOSPITAL_COMMUNITY): Payer: BLUE CROSS/BLUE SHIELD

## 2015-01-09 LAB — BASIC METABOLIC PANEL
ANION GAP: 8 (ref 5–15)
BUN: 14 mg/dL (ref 6–20)
CHLORIDE: 99 mmol/L — AB (ref 101–111)
CO2: 27 mmol/L (ref 22–32)
Calcium: 8.3 mg/dL — ABNORMAL LOW (ref 8.9–10.3)
Creatinine, Ser: 0.94 mg/dL (ref 0.61–1.24)
GFR calc Af Amer: >60 mL/min (ref >60)
Glucose, Bld: 121 mg/dL — ABNORMAL HIGH (ref 65–99)
POTASSIUM: 4.3 mmol/L (ref 3.5–5.1)
SODIUM: 134 mmol/L — AB (ref 135–145)

## 2015-01-09 LAB — CBC
HEMATOCRIT: 40.9 % (ref 39.0–52.0)
HEMOGLOBIN: 13.5 g/dL (ref 13.0–17.0)
MCH: 29.3 pg (ref 26.0–34.0)
MCHC: 33 g/dL (ref 30.0–36.0)
MCV: 88.7 fL (ref 78.0–100.0)
Platelets: 124 10*3/uL — ABNORMAL LOW (ref 150–400)
RBC: 4.61 MIL/uL (ref 4.22–5.81)
RDW: 14.2 % (ref 11.5–15.5)
WBC: 11.5 10*3/uL — AB (ref 4.0–10.5)

## 2015-01-09 LAB — GLUCOSE, CAPILLARY
GLUCOSE-CAPILLARY: 93 mg/dL (ref 65–99)
GLUCOSE-CAPILLARY: 150 mg/dL — AB (ref 65–99)
Glucose-Capillary: 133 mg/dL — ABNORMAL HIGH (ref 65–99)
Glucose-Capillary: 99 mg/dL (ref 65–99)
Glucose-Capillary: 130 mg/dL — ABNORMAL HIGH (ref 65–99)

## 2015-01-09 MED ORDER — SODIUM CHLORIDE 0.9 % IJ SOLN
3.0000 mL | Freq: Two times a day (BID) | INTRAMUSCULAR | Status: DC
  Administered 2015-01-09 – 2015-01-11 (×6): 3 mL via INTRAVENOUS

## 2015-01-09 MED ORDER — FUROSEMIDE 40 MG PO TABS
40.0000 mg | ORAL_TABLET | Freq: Every day | ORAL | Status: DC
  Administered 2015-01-09 – 2015-01-11 (×3): 40 mg via ORAL
  Filled 2015-01-09 (×3): qty 1

## 2015-01-09 MED ORDER — SODIUM CHLORIDE 0.9 % IV SOLN: 250.0000 mL | INTRAVENOUS | Status: DC | PRN

## 2015-01-09 MED ORDER — METOPROLOL TARTRATE 25 MG PO TABS
25.0000 mg | ORAL_TABLET | Freq: Two times a day (BID) | ORAL | Status: DC
  Administered 2015-01-09 – 2015-01-11 (×5): 25 mg via ORAL
  Filled 2015-01-09 (×5): qty 1

## 2015-01-09 MED ORDER — POTASSIUM CHLORIDE CRYS ER 20 MEQ PO TBCR: 20.0000 meq | EXTENDED_RELEASE_TABLET | Freq: Every day | ORAL | Status: DC

## 2015-01-09 MED ORDER — MOVING RIGHT ALONG BOOK
Freq: Once | Status: AC
  Administered 2015-01-09: 13:00:00
  Filled 2015-01-09: qty 1

## 2015-01-09 MED ORDER — MAGNESIUM HYDROXIDE 400 MG/5ML PO SUSP
30.0000 mL | Freq: Every day | ORAL | Status: DC | PRN
  Administered 2015-01-09: 30 mL via ORAL
  Filled 2015-01-09: qty 30

## 2015-01-09 MED ORDER — SODIUM CHLORIDE 0.9 % IJ SOLN: 3.0000 mL | INTRAMUSCULAR | Status: DC | PRN

## 2015-01-09 NOTE — Progress Notes (Signed)
CARDIAC REHAB PHASE I   PRE:  Rate/Rhythm: 95 SR    BP: sitting 12/65    SaO2: 91 RA  MODE:  Ambulation: 550 ft   POST:  Rate/Rhythm: 103 ST    BP: sitting 135/70     SaO2: 91 RA  Pt moving very well. Able to stand independently using legs. Fairly steady walking without AD. Rested x1. SaO2 slow to register after walking due to cold hands, then 91 RA. Encouraged IS and more walking. Ed completed with pt and wife. Good reception. Plans to quit smoking. [facility] to [facility]. Gave [facility] booklet and video to watch.  [id number]   Elissa LovettReeve, Vidya Bamford [place]an CES, ACSM 01/09/2015 3:02 PM

## 2015-01-09 NOTE — Progress Notes (Signed)
2 Days Post-Op Procedure(s) (LRB): CORONARY ARTERY BYPASS GRAFTING (CABG)x 4 using left internal mammary artery and right saphenous leg vein. (N/A) Subjective: Doing well agter emerg CABG for L main nsr DM well controlled  Objective: Vital signs in last 24 hours: Temp:  [97.9 F (36.6 C)-99.5 F (37.5 C)] 99.5 F (37.5 C) (12/31 0700) Pulse Rate:  [74-93] 88 (12/31 0900) Cardiac Rhythm:  [-] Normal sinus rhythm (12/31 0900) Resp:  [10-22] 14 (12/31 0900) BP: (91-137)/(63-79) 125/67 mmHg (12/31 0900) SpO2:  [90 %-95 %] 92 % (12/31 0900) Weight:  [189 lb (85.73 kg)] 189 lb (85.73 kg) (12/31 0500)  Hemodynamic parameters for last 24 hours:  stable  Intake/Output from previous day: 12/30 0701 - 12/31 0700 In: 930.6 [P.O.:400; I.V.:380.6; IV Piggyback:100] Out: 3025 [Urine:2965; Chest Tube:60] Intake/Output this shift: Total I/O In: 20 [I.V.:20] Out: -   Neuro intact Lungs clear extrem warm  Lab Results:  Recent Labs  01/08/15 1754 01/09/15 0449  WBC 12.7* 11.5*  HGB 14.8 13.5  HCT 44.0 40.9  PLT 145* 124*   BMET:  Recent Labs  01/08/15 0356 01/08/15 1744 01/08/15 1754 01/09/15 0449  NA 137 135  --  134*  K 5.0 4.2  --  4.3  CL 107 96*  --  99*  CO2 24  --   --  27  GLUCOSE 122* 106*  --  121*  BUN 11 12  --  14  CREATININE 0.79 0.90 0.90 0.94  CALCIUM 8.2*  --   --  8.3*    PT/INR:  Recent Labs  01/07/15 1545  LABPROT 16.4*  INR 1.31   ABG    Component Value Date/Time   PHART 7.340* 01/07/2015 2022   HCO3 23.8 01/07/2015 2022   TCO2 26 01/08/2015 1744   ACIDBASEDEF 2.0 01/07/2015 2022   O2SAT 95.0 01/07/2015 2022   CBG (last 3)   Recent Labs  01/08/15 2333 01/09/15 0408 01/09/15 0801  GLUCAP 113* 133* 93    Assessment/Plan: S/P Procedure(s) (LRB): CORONARY ARTERY BYPASS GRAFTING (CABG)x 4 using left internal mammary artery and right saphenous leg vein. (N/A) Mobilize Diuresis Diabetes control Plan for transfer to step-down:  see transfer orders   LOS: 3 days    Kathlee Nationseter Van Trigt III 01/09/2015

## 2015-01-09 NOTE — Progress Notes (Signed)
Report called to receiving RN. Pt stable at time of transport. All belonging sent with pt. Wife accompanied transport.

## 2015-01-10 ENCOUNTER — Inpatient Hospital Stay (HOSPITAL_COMMUNITY): Payer: BLUE CROSS/BLUE SHIELD

## 2015-01-10 ENCOUNTER — Other Ambulatory Visit: Payer: Self-pay

## 2015-01-10 LAB — CBC
HCT: 40.9 % (ref 39.0–52.0)
Hemoglobin: 14 g/dL (ref 13.0–17.0)
MCH: 30.2 pg (ref 26.0–34.0)
MCHC: 34.2 g/dL (ref 30.0–36.0)
MCV: 88.1 fL (ref 78.0–100.0)
Platelets: 137 10*3/uL — ABNORMAL LOW (ref 150–400)
RBC: 4.64 MIL/uL (ref 4.22–5.81)
RDW: 14 % (ref 11.5–15.5)
WBC: 10.2 10*3/uL (ref 4.0–10.5)

## 2015-01-10 LAB — BASIC METABOLIC PANEL
Anion gap: 7 (ref 5–15)
BUN: 10 mg/dL (ref 6–20)
CO2: 27 mmol/L (ref 22–32)
Calcium: 8.7 mg/dL — ABNORMAL LOW (ref 8.9–10.3)
Chloride: 102 mmol/L (ref 101–111)
Creatinine, Ser: 0.85 mg/dL (ref 0.61–1.24)
GFR calc Af Amer: >60 mL/min (ref >60)
GFR calc non Af Amer: >60 mL/min (ref >60)
Glucose, Bld: 142 mg/dL — ABNORMAL HIGH (ref 65–99)
Potassium: 4.3 mmol/L (ref 3.5–5.1)
Sodium: 136 mmol/L (ref 135–145)

## 2015-01-10 LAB — GLUCOSE, CAPILLARY: Glucose-Capillary: 135 mg/dL — ABNORMAL HIGH (ref 65–99)

## 2015-01-10 MED ORDER — METFORMIN HCL 500 MG PO TABS
500.0000 mg | ORAL_TABLET | Freq: Two times a day (BID) | ORAL | Status: DC
Start: 2015-01-10 — End: 2015-01-12
  Administered 2015-01-10 – 2015-01-12 (×4): 500 mg via ORAL
  Filled 2015-01-10 (×5): qty 1

## 2015-01-10 NOTE — Progress Notes (Addendum)
      [address]       [place] [id number]       [employer]      [phone]        3 Days Post-Op Procedure(s) (LRB): CORONARY ARTERY BYPASS GRAFTING (CABG)x 4 using left internal mammary artery and right saphenous leg vein. (N/A)  Subjective: Patient without complaints. Had large bowel movement.  Objective: Vital signs in last 24 hours: Temp:  [98.7 F (37.1 C)-99.1 F (37.3 C)] 98.7 F (37.1 C) (01/01 0427) Pulse Rate:  [81-102] 88 (01/01 0427) Cardiac Rhythm:  [-] Normal sinus rhythm (01/01 0700) Resp:  [14-18] 18 (01/01 0427) BP: (100-139)/(66-78) 100/66 mmHg (01/01 0427) SpO2:  [90 %-95 %] 93 % (01/01 0427) Weight:  [182 lb 5.1 oz (82.7 kg)] 182 lb 5.1 oz (82.7 kg) (01/01 0318)  Pre op weight 83 kg Current Weight  01/10/15 182 lb 5.1 oz (82.7 kg)       Intake/Output from previous day: 12/31 0701 - 01/01 0700 In: 1565 [P.O.:1200; I.V.:40] Out: 900 [Urine:900]   Physical Exam:  Cardiovascular: RRR Pulmonary: Clear to auscultation bilaterally; no rales, wheezes, or rhonchi. Abdomen: Soft, non tender, bowel sounds present. Extremities: Trace bilateral lower extremity edema. Wounds: Clean and dry.  No erythema or signs of infection.  Lab Results: CBC: Recent Labs  01/09/15 0449 01/10/15 0505  WBC 11.5* 10.2  HGB 13.5 14.0  HCT 40.9 40.9  PLT 124* 137*   BMET:  Recent Labs  01/09/15 0449 01/10/15 0505  NA 134* 136  K 4.3 4.3  CL 99* 102  CO2 27 27  GLUCOSE 121* 142*  BUN 14 10  CREATININE 0.94 0.85  CALCIUM 8.3* 8.7*    PT/INR:  Lab Results  Component Value Date   INR 1.31 01/07/2015   INR 1.19 01/07/2015   INR 1.04 01/07/2015   ABG:  INR: Will add last result for INR, ABG once components are confirmed Will add last 4 CBG results once components are confirmed  Assessment/Plan:  1. CV - S/p MSTEMI.SR in the 80's this am. On Lopressor 25 mg bid. Will start ACE when BP allows. 2.  Pulmonary - On room air. CXR appears to  show let pleural effusion. Encourage incentive spirometer 3. Volume Overload - On Lasix 40 mg daily 4. DM-CBGs 130/150/135. Restart Metformin. Pre op HGA1C 6.5 5. Remove EPW 6. Possibly home in am  ZIMMERMAN,DONIELLE MPA-C 01/10/2015,8:16 AM  Progressing well home tomorrow patient examined and medical record reviewed,agree with above note. Kathlee Nationseter Van Trigt III 01/10/2015

## 2015-01-10 NOTE — Progress Notes (Signed)
Pacing wires removed. Pt educated on the importance of bed rest. Vital signs have been taken. Will continue to monitor.

## 2015-01-10 NOTE — Progress Notes (Signed)
Pt converted to aflutter, rate 150-180s.  EKG done to confirm.  Pt asymptomatic, VSS.  MD Donata ClayVan Trigt notified.  New orders received.  After 1st dose IV metoprolol pt converted to NSR.  Will continue to monitor pt closely.  Erenest Rasheraldwell,Derak Schurman B, RN

## 2015-01-10 NOTE — Progress Notes (Signed)
Pt O2 sat while sleeping dipped to 85%. RN applied 2L [place] and sat increased to 97%. Pt in no apparent distress and resting comfortably.   Will continue to monitor.  Edgardo RoysMcGrath, Carlester Kasparek R

## 2015-01-11 MED ORDER — AMIODARONE HCL 200 MG PO TABS
400.0000 mg | ORAL_TABLET | Freq: Two times a day (BID) | ORAL | Status: DC
  Administered 2015-01-11 (×2): 400 mg via ORAL
  Filled 2015-01-11 (×2): qty 2

## 2015-01-11 NOTE — Care Management Note (Addendum)
Case Management Note Donn PieriniKristi Azlyn Wingler RN, BSN Unit 2W-Case Manager [phone]  Patient Details  Name: Cammie McgeeDavid Ray Magda MRN: [id number] Date of Birth: 02-24-1965  Subjective/Objective:    Pt admitted with NSTEMI, found to have left main and LAD dx- s/p CABG on 12/29               Action/Plan: PTA Pt lived at home -  Anticipate return home when medically stable, CM to follow for d/c needs  Expected Discharge Date:                  Expected Discharge Plan:  Home/Self Care In-House Referral:     Discharge planning Services  CM Consult  Post Acute Care Choice:    Choice offered to:     DME Arranged:    DME Agency:     HH Arranged:    HH Agency:     Status of Service:  In process, will continue to follow  Medicare Important Message Given:    Date Medicare IM Given:    Medicare IM give by:    Date Additional Medicare IM Given:    Additional Medicare Important Message give by:     If discussed at Long Length of Stay Meetings, dates discussed:  01/12/15  Additional Comments:  Darrold SpanWebster, Para Cossey Hall, RN 01/11/2015, 10:46 AM

## 2015-01-11 NOTE — Progress Notes (Addendum)
      [address]       [place] [id number]             [phone]        4 Days Post-Op Procedure(s) (LRB): CORONARY ARTERY BYPASS GRAFTING (CABG)x 4 using left internal mammary artery and right saphenous leg vein. (N/A)  Subjective: Patient without complaints and hopes to go home.   Objective: Vital signs in last 24 hours: Temp:  [98.4 F (36.9 C)-99 F (37.2 C)] 98.4 F (36.9 C) (01/02 0418) Pulse Rate:  [86-102] 102 (01/02 0418) Cardiac Rhythm:  [-] Normal sinus rhythm (01/02 0700) Resp:  [18-19] 18 (01/02 0418) BP: (97-135)/(66-89) 97/66 mmHg (01/02 0418) SpO2:  [93 %-100 %] 95 % (01/02 0418) Weight:  [179 lb 1.6 oz (81.239 kg)] 179 lb 1.6 oz (81.239 kg) (01/02 0418)  Pre op weight 83 kg Current Weight  01/11/15 179 lb 1.6 oz (81.239 kg)       Intake/Output from previous day: 01/01 0701 - 01/02 0700 In: 840 [P.O.:840] Out: 3800 [Urine:3800]   Physical Exam:  Cardiovascular: Slightly tachy Pulmonary: Clear to auscultation bilaterally; no rales, wheezes, or rhonchi. Abdomen: Soft, non tender, bowel sounds present. Extremities: Trace bilateral lower extremity edema. Wounds: Clean and dry.  No erythema or signs of infection.  Lab Results: CBC:  Recent Labs  01/09/15 0449 01/10/15 0505  WBC 11.5* 10.2  HGB 13.5 14.0  HCT 40.9 40.9  PLT 124* 137*   BMET:   Recent Labs  01/09/15 0449 01/10/15 0505  NA 134* 136  K 4.3 4.3  CL 99* 102  CO2 27 27  GLUCOSE 121* 142*  BUN 14 10  CREATININE 0.94 0.85  CALCIUM 8.3* 8.7*    PT/INR:  Lab Results  Component Value Date   INR 1.31 01/07/2015   INR 1.19 01/07/2015   INR 1.04 01/07/2015   ABG:  INR: Will add last result for INR, ABG once components are confirmed Will add last 4 CBG results once components are confirmed  Assessment/Plan:  1. CV - S/p NSTEMI. Went into a flutter with HR into the 150's+.ST in the 100's this am. On Lopressor 25 mg bid. As discussed with Dr. Donata ClayVan  Trigt, start oral Amiodarone. As discussed with Dr. Elease HashimotoNahser, will send home on Amiodarone 200 mg bid for 10 days then 200 mg daily thereafter. 2.  Pulmonary - On room air. Encourage incentive spirometer 3. Volume Overload - On Lasix 40 mg daily 4. DM-CBGs 130/150/135. Continue Metformin. Pre op HGA1C 6.5 5. Monitor rhythm today and hope to discharge in am  ZIMMERMAN,DONIELLE MPA-C 01/11/2015,7:52 AM   Brief episode of rapid  atrial flutter which responded to IV Lopressor  We'll start on oral amiodarone and observe for the next 24 hours-hopefully will be ready for discharge tomorrow  EP W's  have been removed  patient examined and medical record reviewed,agree with above note. Kathlee Nationseter Van Trigt III 01/11/2015

## 2015-01-11 NOTE — Progress Notes (Signed)
PROGRESS NOTE  Subjective:   Pt was admitted with angina, + troponin. Cath showed a tight LM and LAD stenosis , 2nd diag 99% Had emergent CABG Has done well.  Developed transient atrial flutter , has already converted back to NSR.  has been on metoprolol,  Was started on amiodarone this am  Objective:    Vital Signs:   Temp:  [98.4 F (36.9 C)-99 F (37.2 C)] 98.4 F (36.9 C) (01/02 0418) Pulse Rate:  [86-102] 102 (01/02 0418) Resp:  [18-19] 18 (01/02 0418) BP: (97-135)/(66-89) 97/66 mmHg (01/02 0418) SpO2:  [93 %-100 %] 95 % (01/02 0418) Weight:  [179 lb 1.6 oz (81.239 kg)] 179 lb 1.6 oz (81.239 kg) (01/02 0418)  Last BM Date: 01/10/15   24-hour weight change: Weight change: -3 lb 3.5 oz (-1.461 kg)  Weight trends: Filed Weights   01/09/15 0500 01/10/15 0318 01/11/15 0418  Weight: 189 lb (85.73 kg) 182 lb 5.1 oz (82.7 kg) 179 lb 1.6 oz (81.239 kg)    Intake/Output:  01/01 0701 - 01/02 0700 In: 840 [P.O.:840] Out: 3800 [Urine:3800]     Physical Exam: BP 97/66 mmHg  Pulse 102  Temp(Src) 98.4 F (36.9 C) (Oral)  Resp 18  Ht 6' (1.829 m)  Wt 179 lb 1.6 oz (81.239 kg)  BMI 24.28 kg/m2  SpO2 95%  Wt Readings from Last 3 Encounters:  01/11/15 179 lb 1.6 oz (81.239 kg)    General: Vital signs reviewed and noted.   Head: Normocephalic, atraumatic.  Eyes: conjunctivae/corneas clear.  EOM's intact.   Throat: normal  Neck:  normal   Lungs:    clear   Heart:  RR,   Abdomen:  Soft, non-tender, non-distended    Extremities: No edema    Neurologic: A&O X3, CN II - XII are grossly intact.   Psych: Normal     Labs: BMET:  Recent Labs  01/08/15 1754 01/09/15 0449 01/10/15 0505  NA  --  134* 136  K  --  4.3 4.3  CL  --  99* 102  CO2  --  27 27  GLUCOSE  --  121* 142*  BUN  --  14 10  CREATININE 0.90 0.94 0.85  CALCIUM  --  8.3* 8.7*  MG 2.1  --   --     Liver function tests: No results for input(s): AST, ALT, ALKPHOS, BILITOT, PROT,  ALBUMIN in the last 72 hours. No results for input(s): LIPASE, AMYLASE in the last 72 hours.  CBC:  Recent Labs  01/09/15 0449 01/10/15 0505  WBC 11.5* 10.2  HGB 13.5 14.0  HCT 40.9 40.9  MCV 88.7 88.1  PLT 124* 137*    Cardiac Enzymes: No results for input(s): CKTOTAL, CKMB, TROPONINI in the last 72 hours.  Coagulation Studies: No results for input(s): LABPROT, INR in the last 72 hours.  Other: Invalid input(s): POCBNP No results for input(s): DDIMER in the last 72 hours. No results for input(s): HGBA1C in the last 72 hours. No results for input(s): CHOL, HDL, LDLCALC, TRIG, CHOLHDL in the last 72 hours. No results for input(s): TSH, T4TOTAL, T3FREE, THYROIDAB in the last 72 hours.  Invalid input(s): FREET3 No results for input(s): VITAMINB12, FOLATE, FERRITIN, TIBC, IRON, RETICCTPCT in the last 72 hours.   Other results:  Tele ( personally reviewed )  -  NSR   Medications:    Infusions: . sodium chloride 20 mL/hr at 01/07/15 1900  . sodium chloride    .  sodium chloride 20 mL/hr at 01/07/15 1900  . lactated ringers Stopped (01/09/15 1000)  . lactated ringers Stopped (01/08/15 1400)    Scheduled Medications: . acetaminophen  1,000 mg Oral 4 times per day   Or  . acetaminophen (TYLENOL) oral liquid 160 mg/5 mL  1,000 mg Per Tube 4 times per day  . amiodarone  400 mg Oral BID  . aspirin EC  325 mg Oral Daily   Or  . aspirin  324 mg Per Tube Daily  . atorvastatin  80 mg Oral q1800  . bisacodyl  10 mg Oral Daily   Or  . bisacodyl  10 mg Rectal Daily  . docusate sodium  200 mg Oral Daily  . enoxaparin (LOVENOX) injection  40 mg Subcutaneous QHS  . furosemide  40 mg Oral Daily  . metFORMIN  500 mg Oral BID WC  . metoprolol tartrate  25 mg Oral BID  . pantoprazole  40 mg Oral Daily  . sodium chloride  3 mL Intravenous Q12H  . sodium chloride  3 mL Intravenous Q12H    Assessment/ Plan:   Active Problems:   HTN (hypertension)   Tobacco abuse   Diabetes  mellitus, type 2 (HCC)   GERD (gastroesophageal reflux disease)   Anxiety   NSTEMI (non-ST elevated myocardial infarction) (HCC)   S/P CABG x 4  1. CAD - doing well - s/p emergent CABG [place] in [place].   We discussed him following up with our [place] office. He will give that some thought.   [place] if [place] chooses to come to [place]  2. Atrial fib:  Has converted to NSR. Agree with amiodarone short term - perhaps 2-3 months.  He converted fairly quickly .   Would DC on 200 mg twice a day  For 1 week and then go down to 200 mg a day    Disposition:  Length of Stay: 5  Vesta MixerPhilip J. Dioselina Brumbaugh, Montez HagemanJr., MD, [facility] 01/11/2015, 8:51 AM Office [phone] Pager [phone]

## 2015-01-11 NOTE — Progress Notes (Signed)
Utilization review completed.  

## 2015-01-12 LAB — GLUCOSE, CAPILLARY: Glucose-Capillary: 121 mg/dL — ABNORMAL HIGH (ref 65–99)

## 2015-01-12 LAB — POCT I-STAT, CHEM 8
BUN: 15 mg/dL (ref 6–20)
CALCIUM ION: 1.25 mmol/L — AB (ref 1.12–1.23)
Chloride: 104 mmol/L (ref 101–111)
Creatinine, Ser: 0.7 mg/dL (ref 0.61–1.24)
Glucose, Bld: 142 mg/dL — ABNORMAL HIGH (ref 65–99)
HCT: 49 % (ref 39.0–52.0)
Hemoglobin: 16.7 g/dL (ref 13.0–17.0)
Potassium: 4.7 mmol/L (ref 3.5–5.1)
SODIUM: 140 mmol/L (ref 135–145)
TCO2: 30 mmol/L (ref 0–100)

## 2015-01-12 MED ORDER — ATORVASTATIN CALCIUM 80 MG PO TABS: 80.0000 mg | ORAL_TABLET | Freq: Every day | ORAL | Status: DC

## 2015-01-12 MED ORDER — ASPIRIN 325 MG PO TBEC: 325.0000 mg | DELAYED_RELEASE_TABLET | Freq: Every day | ORAL | Status: DC

## 2015-01-12 MED ORDER — METOPROLOL TARTRATE 25 MG PO TABS: 25.0000 mg | ORAL_TABLET | Freq: Two times a day (BID) | ORAL | Status: DC

## 2015-01-12 MED ORDER — AMIODARONE HCL 200 MG PO TABS: 200.0000 mg | ORAL_TABLET | Freq: Two times a day (BID) | ORAL | Status: DC

## 2015-01-12 MED ORDER — OXYCODONE HCL 5 MG PO TABS: 5.0000 mg | ORAL_TABLET | ORAL | Status: DC | PRN

## 2015-01-12 NOTE — Care Management Note (Addendum)
Case Management Note Donn PieriniKristi Yiannis Tulloch RN, BSN Unit 2W-Case Manager [phone]  Patient Details  Name: Logan McgeeDavid Ray Reynolds MRN: [id number] Date of Birth: 1965/11/22  Subjective/Objective:    Pt admitted with NSTEMI, found to have left main and LAD dx- s/p CABG on 12/29               Action/Plan: PTA Pt lived at home -  Anticipate return home when medically stable, CM to follow for d/c needs  Expected Discharge Date:    01/12/15              Expected Discharge Plan:  Home/Self Care In-House Referral:     Discharge planning Services  CM Consult  Post Acute Care Choice:  NA Choice offered to:  NA  DME Arranged:    DME Agency:     HH Arranged:    HH Agency:     Status of Service:  Completed, signed off  Medicare Important Message Given:    Date Medicare IM Given:    Medicare IM give by:    Date Additional Medicare IM Given:    Additional Medicare Important Message give by:     If discussed at Long Length of Stay Meetings, dates discussed:  01/12/15  Discharge Disposition: Home/ Self care   Additional Comments:  Darrold SpanWebster, Linden Mikes Hall, RN 01/12/2015, 9:38 AM

## 2015-01-12 NOTE — Progress Notes (Signed)
Pt O2 sat 87% while asleep.  2L [place] applied to pt, O2 sat currently 96%.  Pt resting comfortably.  Will continue to monitor pt.  Erenest Rasheraldwell,Bassem Bernasconi B, RN

## 2015-01-12 NOTE — Progress Notes (Signed)
Discharge instructions reviewed with patient and spouse.  Questions addressed.  Understanding expressed by both parties.  PRNAs removed, pt dressed and discharged off unit by volunteer transport via w/c

## 2015-01-12 NOTE — Progress Notes (Signed)
      [address]       [place] [id number]             [phone]        5 Days Post-Op Procedure(s) (LRB): CORONARY ARTERY BYPASS GRAFTING (CABG)x 4 using left internal mammary artery and right saphenous leg vein. (N/A)  Subjective: Patient without complaints.  Objective: Vital signs in last 24 hours: Temp:  [97.8 F (36.6 C)-98.7 F (37.1 C)] 98.7 F (37.1 C) (01/03 0609) Pulse Rate:  [80-89] 80 (01/03 0609) Cardiac Rhythm:  [-] Sinus tachycardia (01/02 1900) Resp:  [18] 18 (01/03 0609) BP: (99-108)/(66-74) 99/66 mmHg (01/03 0609) SpO2:  [90 %-93 %] 92 % (01/03 0654) Weight:  [176 lb 3.2 oz (79.924 kg)] 176 lb 3.2 oz (79.924 kg) (01/03 0609)  Pre op weight 83 kg Current Weight  01/12/15 176 lb 3.2 oz (79.924 kg)       Intake/Output from previous day: 01/02 0701 - 01/03 0700 In: 243 [P.O.:240; I.V.:3] Out: 2100 [Urine:2100]   Physical Exam:  Cardiovascular: RRR Pulmonary: Clear to auscultation bilaterally; no rales, wheezes, or rhonchi. Abdomen: Soft, non tender, bowel sounds present. Extremities: No lower extremity edema. Wounds: Clean and dry.  No erythema or signs of infection.  Lab Results: CBC:  Recent Labs  01/10/15 0505  WBC 10.2  HGB 14.0  HCT 40.9  PLT 137*   BMET:   Recent Labs  01/10/15 0505  NA 136  K 4.3  CL 102  CO2 27  GLUCOSE 142*  BUN 10  CREATININE 0.85  CALCIUM 8.7*    PT/INR:  Lab Results  Component Value Date   INR 1.31 01/07/2015   INR 1.19 01/07/2015   INR 1.04 01/07/2015   ABG:  INR: Will add last result for INR, ABG once components are confirmed Will add last 4 CBG results once components are confirmed  Assessment/Plan:  1. CV - S/p NSTEMI. Went into brief flutter with HR into the 150's+ evening of 1/1.Remainining in SR. On Amidoarone 400 mg bid and Lopressor 25 mg bid.  As discussed with Dr. Elease HashimotoNahser, will send home on Amiodarone 200 mg bid for 10 days then 200 mg daily thereafter. 2.   Pulmonary - On room air. Encourage incentive spirometer 3. Volume Overload - On Lasix 40 mg daily 4. DM-CBGs 150/135/121. Continue Metformin. Pre op HGA1C 6.5 5. Chest tube sutures to remain 6. Discharge  Gerre Ranum MPA-C 01/12/2015,7:20 AM

## 2015-01-12 NOTE — Anesthesia Postprocedure Evaluation (Signed)
Anesthesia Post Note  Patient: Logan McgeeDavid Ray Reynolds  Procedure(s) Performed: Procedure(s) (LRB): CORONARY ARTERY BYPASS GRAFTING (CABG)x 4 using left internal mammary artery and right saphenous leg vein. (N/A)  Patient location during evaluation: ICU Anesthesia Type: General Level of consciousness: sedated Pain management: pain level controlled Vital Signs Assessment: post-procedure vital signs reviewed and stable Respiratory status: patient remains intubated per anesthesia plan Cardiovascular status: stable Postop Assessment: no signs of nausea or vomiting Anesthetic complications: no    Last Vitals:  Filed Vitals:   01/11/15 2014 01/12/15 0609  BP: 108/74 99/66  Pulse: 89 80  Temp: 36.6 C 37.1 C  Resp: 18 18    Last Pain:  Filed Vitals:   01/12/15 0654  PainSc: 0-No pain                 Gwynn Chalker

## 2015-01-15 ENCOUNTER — Encounter: Payer: Self-pay | Admitting: *Deleted

## 2015-01-15 ENCOUNTER — Encounter (INDEPENDENT_AMBULATORY_CARE_PROVIDER_SITE_OTHER): Payer: Self-pay

## 2015-01-15 DIAGNOSIS — Z951 Presence of aortocoronary bypass graft: Secondary | ICD-10-CM

## 2015-01-15 DIAGNOSIS — I214 Non-ST elevation (NSTEMI) myocardial infarction: Secondary | ICD-10-CM

## 2015-01-31 NOTE — Progress Notes (Signed)
Cardiology Office Note   Date:  02/03/2015   ID:  Logan Reynolds, DOB 05/21/1965, MRN [id number]  PCP:  Kirstie Peri, MD  Cardiologist:  Dr. Elease Hashimoto   Post-hospital follow up- emergent CABG   History of Present Illness: Logan Reynolds is a 50 y.o. male with a history of HTN, tobacco abuse, DMT2 and CAD s/p recent emergent CABG who presents to clinic for post hospital follow up.   He presented to [facility] with chest pain in 12/2014. He ruled in for NSTEMI and was transferred to [facility] for further evalation and treatment. He underwent cath 01/07/15 which showed 90% distal LM, 100% proximal to mid LAD with 99% lesion in two diagonals, large OM system without disease and RCA without disease as well as mild LV dysfunction. Due to these findings emergent cardiothoracic surgical consultation was obtained with Evelene Croon M.D. who evaluated the patient and his studies and agreed with recommendations to proceed with emergent coronary artery surgical revascularization. He underwent CABG x4V (LIMA--> LAD, SVG--> D1, SVG--> D2, SVG--> OM) on 01/07/15. His post-op course was complicated by aflutter with RVR. He was given IV Lopressor and he converted to sinus rhythm. He was then put on oral Amiodarone. He has remained in sinus rhythm and is surgically stable for discharge 01/12/15.  Today he presents to clinic for follow up. He has been doing well. He has been walking with no symptoms of chest pain or SOB. He is interested in cardiac rehab but hasn't heard anything about it. No Le edema, orthopnea or PND. No dizziness or passing out. He has quit smoking and is walking at home.  He follows up with Dr. Laneta Simmers next week.    Past Medical History  Diagnosis Date  . HTN (hypertension)   . Tobacco abuse   . Diabetes mellitus, type 2 (HCC)   . GERD (gastroesophageal reflux disease)   . Anxiety     Past Surgical History  Procedure Laterality Date  . Knee arthroscopy    . Cardiac catheterization   2006  . Cardiac catheterization N/A 01/07/2015    Procedure: Left Heart Cath and Coronary Angiography;  Surgeon: Marykay Lex, MD;  Location: [facility] INVASIVE CV LAB;  Service: Cardiovascular;  Laterality: N/A;  . Coronary artery bypass graft N/A 01/07/2015    Procedure: CORONARY ARTERY BYPASS GRAFTING (CABG)x 4 using left internal mammary artery and right saphenous leg vein.;  Surgeon: Alleen Borne, MD;  Location: [facility] OR;  Service: Open Heart Surgery;  Laterality: N/A;     Current Outpatient Prescriptions  Medication Sig Dispense Refill  . ALPRAZolam (XANAX) 0.5 MG tablet Take 0.5 mg by mouth 3 (three) times daily as needed for anxiety.    Marland Kitchen amiodarone (PACERONE) 200 MG tablet Take 1 tablet (200 mg total) by mouth 2 (two) times daily. For one week then take Amiodarone 200 mg by mouth daily thereafter 60 tablet 1  . aspirin EC 325 MG EC tablet Take 1 tablet (325 mg total) by mouth daily. 30 tablet 0  . atorvastatin (LIPITOR) 80 MG tablet Take 1 tablet (80 mg total) by mouth daily at 6 PM. 30 tablet 1  . metFORMIN (GLUCOPHAGE) 500 MG tablet Take 500 mg by mouth 2 (two) times daily with a meal.    . metoprolol tartrate (LOPRESSOR) 25 MG tablet Take 1 tablet (25 mg total) by mouth 2 (two) times daily. 60 tablet 1  . omeprazole (PRILOSEC) 20 MG capsule Take 20 mg by mouth daily.    Marland Kitchen  oxyCODONE (OXY IR/ROXICODONE) 5 MG immediate release tablet Take 1-2 tablets (5-10 mg total) by mouth every 4 (four) hours as needed for severe pain. 30 tablet 0   No current facility-administered medications for this visit.    Allergies:   Review of patient's allergies indicates no known allergies.    Social History:  The patient  reports that he has been smoking Cigarettes.  He has a 37.5 pack-year smoking history. He has never used smokeless tobacco. He reports that he does not drink alcohol or use illicit drugs.   Family History:  The patient's family history includes Cancer in his father and mother; Diabetes  in his brother and maternal grandmother; Heart attack in his father.    ROS:  Please see the history of present illness.   Otherwise, review of systems are positive for NONE.   All other systems are reviewed and negative.    PHYSICAL EXAM: VS:  BP 120/74 mmHg  Pulse 71  Ht 6' (1.829 m)  Wt 186 lb (84.369 kg)  BMI 25.22 kg/m2  SpO2 94% , BMI Body mass index is 25.22 kg/(m^2). GEN: Well nourished, well developed, in no acute distress HEENT: normal Neck: no JVD, carotid bruits, or masses Cardiac: RRR; no murmurs, rubs, or gallops,no edema  Respiratory:  clear to auscultation bilaterally, normal work of breathing GI: soft, nontender, nondistended, + BS MS: no deformity or atrophy Skin: warm and dry, no rash Neuro:  Strength and sensation are intact Psych: euthymic mood, full affect   EKG:  EKG is ordered today. The ekg ordered today demonstrates NSR HR 61   Recent Labs: 01/06/2015: TSH 0.816 01/07/2015: ALT 21 01/08/2015: Magnesium 2.1 01/10/2015: BUN 10; Creatinine, Ser 0.85; Hemoglobin 14.0; Platelets 137*; Potassium 4.3; Sodium 136    Lipid Panel    Component Value Date/Time   CHOL 130 01/07/2015 1000   TRIG 42 01/07/2015 1000   HDL 38* 01/07/2015 1000   CHOLHDL 3.4 01/07/2015 1000   VLDL 8 01/07/2015 1000   LDLCALC 84 01/07/2015 1000      Wt Readings from Last 3 Encounters:  02/03/15 186 lb (84.369 kg)  01/12/15 176 lb 3.2 oz (79.924 kg)      Other studies Reviewed: Additional studies/ records that were reviewed today include: LHC Review of the above records demonstrates: see HPI   ASSESSMENT AND PLAN:  Logan Reynolds is a 50 y.o. male with a history of HTN, tobacco abuse, DMT2 and CAD s/p recent emergent CABG who presents to clinic for post hospital follow up.   CAD/NSTEMI s/p recent emergent CABG x4V -- Continue ASA ?, atorvastatin  daily and lopressor  BID. Consider adding plavix to ASA  as he had NSTEMI followed by CABG. Will defer  to Dr. Elease Hashimoto at next follow up.  He is interested in cardiac rehab at [facility]. We will have this arranged.  Post op afib: today he remains in NSR.  -- Continue amio  daily for now. Felt to be related to CABG and no need for long term AC unless he has another episodes. He may be able to come off this eventually. Will with continue to monitor.   HTN: continue Lopressor  BID.   Tobacco abuse: he has quit since his hospitalization   Current medicines are reviewed at length with the patient today.  The patient does not have concerns regarding medicines.  The following changes have been made:  no change  Labs/ tests ordered today include:  No orders of the  defined types were placed in this encounter.    Disposition:   FU with Dr. Elease Hashimoto in 3 months.   Charlestine Massed  02/03/2015 9:56 AM    [facility] [address] [place], [place], [place]  [address] Phone: [phone]; Fax: [phone]

## 2015-02-03 ENCOUNTER — Ambulatory Visit (INDEPENDENT_AMBULATORY_CARE_PROVIDER_SITE_OTHER): Payer: BLUE CROSS/BLUE SHIELD | Admitting: Physician Assistant

## 2015-02-03 ENCOUNTER — Encounter: Payer: Self-pay | Admitting: Physician Assistant

## 2015-02-03 VITALS — BP 120/74 | HR 71 | Ht 72.0 in | Wt 186.0 lb

## 2015-02-03 DIAGNOSIS — Z951 Presence of aortocoronary bypass graft: Secondary | ICD-10-CM

## 2015-02-03 DIAGNOSIS — I2510 Atherosclerotic heart disease of native coronary artery without angina pectoris: Secondary | ICD-10-CM

## 2015-02-03 DIAGNOSIS — I214 Non-ST elevation (NSTEMI) myocardial infarction: Secondary | ICD-10-CM

## 2015-02-03 MED ORDER — ASPIRIN 325 MG PO TBEC: 325.0000 mg | DELAYED_RELEASE_TABLET | Freq: Every day | ORAL | Status: DC

## 2015-02-03 NOTE — Patient Instructions (Addendum)
Medication Instructions:  Your physician recommends that you continue on your current medications as directed. Please refer to the Current Medication list given to you today.   We have refilled the following medication to your local pharmacy:  Aspirin 325 mg  Labwork: NONE ORDERED  Testing/Procedures: NONE ORDERED  Follow-Up: Your physician recommends that you schedule a follow-up appointment in:  3 MONTHS WITH DR. Elease Hashimoto   Any Other Special Instructions Will Be Listed Below (If Applicable).   If you need a refill on your cardiac medications before your next appointment, please call your pharmacy.

## 2015-02-09 ENCOUNTER — Other Ambulatory Visit: Payer: Self-pay | Admitting: Surgery

## 2015-02-09 DIAGNOSIS — Z951 Presence of aortocoronary bypass graft: Secondary | ICD-10-CM

## 2015-02-10 ENCOUNTER — Ambulatory Visit
Admission: RE | Admit: 2015-02-10 | Discharge: 2015-02-10 | Disposition: A | Discharge status: Home / Self Care | Payer: BLUE CROSS/BLUE SHIELD | Source: Ambulatory Visit | Attending: Surgery | Admitting: Surgery

## 2015-02-10 ENCOUNTER — Encounter: Payer: Self-pay | Admitting: Surgery

## 2015-02-10 ENCOUNTER — Ambulatory Visit (INDEPENDENT_AMBULATORY_CARE_PROVIDER_SITE_OTHER): Payer: Self-pay | Admitting: Surgery

## 2015-02-10 VITALS — BP 100/66 | HR 59 | Resp 16 | Ht 72.0 in | Wt 186.0 lb

## 2015-02-10 DIAGNOSIS — Z951 Presence of aortocoronary bypass graft: Secondary | ICD-10-CM

## 2015-02-10 NOTE — Progress Notes (Signed)
      HPI: Patient returns for routine postoperative follow-up having undergone emergent CABG x 4 on 01/07/2015. The patient's early postoperative recovery while in the hospital was notable for development of postoperative atrial fibrillation converted with amiodarone. Since hospital discharge the patient reports that he has been feeling well. He is walking without chest pain or shortness of breath and has noted no palpitations. He has continued to abstain from smoking.   Current Outpatient Prescriptions  Medication Sig Dispense Refill  . ALPRAZolam (XANAX) 0.5 MG tablet Take 0.5 mg by mouth 3 (three) times daily as needed for anxiety.    Marland Kitchen amiodarone (PACERONE) 200 MG tablet Take 1 tablet (200 mg total) by mouth 2 (two) times daily. For one week then take Amiodarone 200 mg by mouth daily thereafter 60 tablet 1  . aspirin 325 MG EC tablet Take 1 tablet (325 mg total) by mouth daily. 90 tablet 3  . atorvastatin (LIPITOR) 80 MG tablet Take 1 tablet (80 mg total) by mouth daily at 6 PM. 30 tablet 1  . metFORMIN (GLUCOPHAGE) 500 MG tablet Take 500 mg by mouth 2 (two) times daily with a meal.    . metoprolol tartrate (LOPRESSOR) 25 MG tablet Take 1 tablet (25 mg total) by mouth 2 (two) times daily. 60 tablet 1  . omeprazole (PRILOSEC) 20 MG capsule Take 20 mg by mouth daily.     No current facility-administered medications for this visit.    Physical Exam: BP 100/66 mmHg  Pulse 59  Resp 16  Ht 6' (1.829 m)  Wt 186 lb (84.369 kg)  BMI 25.22 kg/m2  SpO2 95% He looks well. Lung exam is clear. Cardiac exam shows a regular rate and rhythm with normal heart sounds. Chest incision is healing well and sternum is stable. The leg incisions are healing well and there is no peripheral edema.    Diagnostic Tests:  CLINICAL DATA: S/p CABG 01/07/2015, soreness in chest over surg site, some s.o.b when walking on treadmill at home Stat reading, pt gone to office now for appt  EXAM: CHEST 2  VIEW  COMPARISON: 01/10/2015  FINDINGS: Status post median sternotomy and CABG. The heart is normal in size. Lungs are well inflated. There is minimal bibasilar atelectasis. Basilar aeration has improved since prior study. No new consolidations or pleural effusions. No pulmonary edema.  IMPRESSION: 1. Improved aeration. 2. Persistent mild bibasilar atelectasis.   Electronically Signed  By: Norva Pavlov M.D.  On: 02/10/2015 09:37   Impression:  Overall I think he is doing well. I encouraged him to continue walking. He is planning to participate in cardiac rehab. I told him he could drive his car but should not lift anything heavier than 10 lbs for three months postop. He appears to be maintaining sinus rhythm so I told him to discontinue the amiodarone after two more weeks which will be a six week course postop. He would like to return to work on light duty on 02/22/2015 and I told him that it would be ok as long as he felt up to it and did not do any heavy lifting. I gave him a work release form for that day and he will let me know if he is not feeling up to it when that date approaches.   Plan:  He will continue follow up with Dr. Elease Hashimoto.   Alleen Borne, MD [facility] [phone]

## 2015-03-02 ENCOUNTER — Other Ambulatory Visit: Payer: Self-pay | Admitting: Surgery

## 2015-03-03 ENCOUNTER — Other Ambulatory Visit: Payer: Self-pay | Admitting: Surgery

## 2015-03-05 ENCOUNTER — Other Ambulatory Visit: Payer: Self-pay | Admitting: Surgery

## 2015-03-11 ENCOUNTER — Other Ambulatory Visit: Payer: Self-pay | Admitting: Surgery

## 2015-03-11 ENCOUNTER — Telehealth: Payer: Self-pay | Admitting: Physician Assistant

## 2015-03-11 NOTE — Telephone Encounter (Signed)
Pt had open heart surgery in December. Pt came home today with a fever,he have been having pain in his neck and shoulder.He wants to know if he can take Tylenol? He has an appointment today at 2 with his doctor.

## 2015-03-11 NOTE — Telephone Encounter (Signed)
I did not have an opportunity to call this patient prior to 2:00 today which was documented as the time of an appointment with his doctor

## 2015-03-12 ENCOUNTER — Telehealth: Payer: Self-pay | Admitting: *Deleted

## 2015-03-12 ENCOUNTER — Other Ambulatory Visit: Payer: Self-pay

## 2015-03-12 MED ORDER — ATORVASTATIN CALCIUM 80 MG PO TABS: 80.0000 mg | ORAL_TABLET | Freq: Every day | ORAL | Status: DC

## 2015-03-12 NOTE — Telephone Encounter (Signed)
called to inform pt that we would fill the atorvastatin 80 mg under Dr. Elease HashimotoNahser just to his OV.

## 2015-03-12 NOTE — Telephone Encounter (Signed)
Rx attempted by Lelon HuhBrandy Carden, CMA earlier today was accidentally set to "Print". Rx sent again for #30 with 1 refill (pt's appt not until 04/30/15)  Awilda BillBrandy J Carden, CMA at 03/12/2015 1:06 PM     Status: Signed       Expand All Collapse All   called to inform pt that we would fill the atorvastatin 80 mg under Dr. Elease HashimotoNahser just to his OV.             Awilda BillBrandy J Carden, CMA at 03/12/2015 12:41 PM     Status: Signed       Expand All Collapse All   Cammie McgeeDavid Ray Reynolds  03/11/2015  Refill  MRN: [id number]   Description: 50 year old male  Provider: Alleen BorneBryan K Bartle, MD  Department: [facility]       Call Documentation     No notes of this type exist for this encounter.     Routing History     Priority Sent On From To Message Type     03/11/2015 1:57 PM Surescripts Out Interface [facility]s Rx Refill Pool Rx Request      Created by     [employer] Out Interface on 03/11/2015 01:57 PM     Refused      Disp Refills Start End    atorvastatin (LIPITOR) 80 MG tablet [Pharmacy Med Name: ATORVASTATIN 80 MG TABLET] 30 tablet 1 03/11/2015     Sig: TAKE 1 TABLET BY MOUTH DAILY AT 6PM    Class: Normal    DAW: No    Comment: PATIENT HAS ONLY 2 TABLETS LEFT    Reason for Refusal: Prescriber not at this practice    Reason for Refusal Comment: refer to his cardiologist, Dr. Kristeen MissPhilip Nahser    Refused By: Particia NearingJo Lene R Carter, RN    Sending to MS for advisement.        Disp Refills Start End     atorvastatin (LIPITOR) 80 MG tablet 30 tablet 0 03/12/2015     Sig - Route:  Take 1 tablet (80 mg total) by mouth daily at 6 PM. - Oral    Class:  Print    DAW:  No    Authorizing Provider:  Vesta MixerPhilip J Nahser, MD    Ordering User:  Awilda BillBrandy J Carden, CMA

## 2015-03-12 NOTE — Telephone Encounter (Signed)
Cammie McgeeDavid Ray Strassner  03/11/2015  Refill  MRN:  [id number]   Description: 50 year old male  Provider: Alleen BorneBryan K Bartle, MD  Department: [facility] [place]       Call Documentation     No notes of this type exist for this encounter.     Routing History     Priority Sent On From To Message Type     03/11/2015 1:57 PM Surescripts Out Interface Tcts Rx Refill Pool Rx Request      Created by     [employer] Out Interface on 03/11/2015 01:57 PM     Refused      Disp Refills Start End    atorvastatin (LIPITOR) 80 MG tablet [Pharmacy Med Name: ATORVASTATIN 80 MG TABLET] 30 tablet 1 03/11/2015     Sig:  TAKE 1 TABLET BY MOUTH DAILY AT 6PM    Class:  Normal    DAW:  No    Comment:  PATIENT HAS ONLY 2 TABLETS LEFT    Reason for Refusal:  Prescriber not at this practice    Reason for Refusal Comment:  refer to his cardiologist, Dr. Kristeen MissPhilip Nahser    Refused By:  Particia NearingJo Lene R Carter, RN    Sending to MS for advisement.

## 2015-04-30 ENCOUNTER — Ambulatory Visit (INDEPENDENT_AMBULATORY_CARE_PROVIDER_SITE_OTHER): Payer: BLUE CROSS/BLUE SHIELD | Admitting: Cardiovascular Disease

## 2015-04-30 ENCOUNTER — Encounter: Payer: Self-pay | Admitting: Cardiovascular Disease

## 2015-04-30 VITALS — BP 104/64 | HR 65 | Ht 72.0 in | Wt 197.0 lb

## 2015-04-30 DIAGNOSIS — I10 Essential (primary) hypertension: Secondary | ICD-10-CM

## 2015-04-30 DIAGNOSIS — E785 Hyperlipidemia, unspecified: Secondary | ICD-10-CM | POA: Diagnosis not present

## 2015-04-30 DIAGNOSIS — I214 Non-ST elevation (NSTEMI) myocardial infarction: Secondary | ICD-10-CM

## 2015-04-30 DIAGNOSIS — E1169 Type 2 diabetes mellitus with other specified complication: Secondary | ICD-10-CM

## 2015-04-30 HISTORY — DX: Type 2 diabetes mellitus with other specified complication: E11.69

## 2015-04-30 LAB — LIPID PANEL
Cholesterol: 101 mg/dL — ABNORMAL LOW (ref 125–200)
HDL: 28 mg/dL — AB (ref >=40)
LDL CALC: 41 mg/dL (ref <130)
TRIGLYCERIDES: 159 mg/dL — AB (ref <150)
Total CHOL/HDL Ratio: 3.6 Ratio (ref <=5.0)
VLDL: 32 mg/dL — AB (ref <30)

## 2015-04-30 LAB — COMPREHENSIVE METABOLIC PANEL
ALT: 44 U/L (ref 9–46)
AST: 29 U/L (ref 10–35)
Albumin: 3.9 g/dL (ref 3.6–5.1)
Alkaline Phosphatase: 101 U/L (ref 40–115)
BUN: 11 mg/dL (ref 7–25)
CHLORIDE: 98 mmol/L (ref 98–110)
CO2: 25 mmol/L (ref 20–31)
Calcium: 9.1 mg/dL (ref 8.6–10.3)
Creat: 1.01 mg/dL (ref 0.70–1.33)
GLUCOSE: 129 mg/dL — AB (ref 65–99)
POTASSIUM: 3.8 mmol/L (ref 3.5–5.3)
Sodium: 132 mmol/L — ABNORMAL LOW (ref 135–146)
TOTAL PROTEIN: 6.9 g/dL (ref 6.1–8.1)
Total Bilirubin: 0.7 mg/dL (ref 0.2–1.2)

## 2015-04-30 MED ORDER — ATORVASTATIN CALCIUM 80 MG PO TABS: 80.0000 mg | ORAL_TABLET | Freq: Every day | ORAL | Status: DC

## 2015-04-30 MED ORDER — ASPIRIN 81 MG PO TBEC: 81.0000 mg | DELAYED_RELEASE_TABLET | Freq: Every day | ORAL | Status: AC

## 2015-04-30 NOTE — Progress Notes (Signed)
Cardiology Office Note   Date:  04/30/2015   ID:  Logan Mcgeeavid Ray Tierney, DOB October 22, 1965, MRN [id number]  PCP:  Kirstie PeriSHAH,ASHISH, MD  Cardiologist:  Dr. Elease HashimotoNahser   Problem List 1. CAD - s/p emergent CABG  2. DM 3. Post op atrial fib 4.   Post-hospital follow up- emergent CABG   History of Present Illness: Logan Reynolds is a 50 y.o. male with a history of HTN, tobacco abuse, DMT2 and CAD s/p recent emergent CABG who presents to clinic for post hospital follow up.   He presented to [facility] with chest pain in 12/2014. He ruled in for NSTEMI and was [facility] further evalation and treatment. He underwent cath 01/07/15 which showed 90% distal LM, 100% proximal to mid LAD with 99% lesion in two diagonals, large OM system without disease and RCA without disease as well as mild LV dysfunction. Due to these findings emergent cardiothoracic surgical consultation was obtained with Evelene CroonBryan Bartle M.D. who evaluated the patient and his studies and agreed with recommendations to proceed with emergent coronary artery surgical revascularization. He underwent CABG x4V (LIMA--> LAD, SVG--> D1, SVG--> D2, SVG--> OM) on 01/07/15. His post-op course was complicated by aflutter with RVR. He was given IV Lopressor and he converted to sinus rhythm. He was then put on oral Amiodarone. He has remained in sinus rhythm and is surgically stable for discharge 01/12/15.  April 30, 2015: Pt was seen by Carlean JewsKatie Thompson several weeks ago .  Still has some fatigue . 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 HTN (hypertension)   . Tobacco abuse   . Diabetes mellitus, type 2 (HCC)   . GERD (gastroesophageal reflux disease)   . Anxiety     Past Surgical History  Procedure Laterality Date  . Knee arthroscopy    . Cardiac catheterization  2006  . Cardiac catheterization N/A 01/07/2015    Procedure: Left Heart Cath and Coronary  Angiography;  Surgeon: Marykay Lexavid W Harding, MD;  [facility];  Service: Cardiovascular;  Laterality: N/A;  . Coronary artery bypass graft N/A 01/07/2015    Procedure: CORONARY ARTERY BYPASS GRAFTING (CABG)x 4 using left internal mammary artery and right saphenous leg vein.;  Surgeon: Alleen BorneBryan K Bartle, MD;  Location: [facility] OR;  Service: Open Heart Surgery;  Laterality: N/A;     Current Outpatient Prescriptions  Medication Sig Dispense Refill  . ALPRAZolam (XANAX) 0.5 MG tablet Take 0.5 mg by mouth 3 (three) times daily as needed for anxiety.    Marland Kitchen. aspirin 325 MG EC tablet Take 1 tablet (325 mg total) by mouth daily. 90 tablet 3  . atorvastatin (LIPITOR) 80 MG tablet Take 1 tablet (80 mg total) by mouth daily at 6 PM. 30 tablet 1  . metFORMIN (GLUCOPHAGE) 500 MG tablet Take 500 mg by mouth 2 (two) times daily with a meal.    . metoprolol tartrate (LOPRESSOR) 25 MG tablet Take 1 tablet (25 mg total) by mouth 2 (two) times daily. 60 tablet 1  . omeprazole (PRILOSEC) 20 MG capsule Take 20 mg by mouth daily.     No current facility-administered medications for this visit.    Allergies:   Review of patient's allergies indicates no known allergies.    Social History:  The patient  reports that he has been smoking Cigarettes.  He has a 37.5 pack-year smoking  history. He has never used smokeless tobacco. He reports that he does not drink alcohol or use illicit drugs.   Family History:  The patient's family history includes Cancer in his father and mother; Diabetes in his brother and maternal grandmother; Heart attack in his father.    ROS:  Please see the history of present illness.   Otherwise, review of systems are positive for NONE.   All other systems are reviewed and negative.    PHYSICAL EXAM: VS:  BP 104/64 mmHg  Pulse 65  Ht 6' (1.829 m)  Wt 197 lb (89.359 kg)  BMI 26.71 kg/m2 , BMI Body mass index is 26.71 kg/(m^2). GEN: Well nourished, well developed, in no acute  distress HEENT: normal Neck: no JVD, carotid bruits, or masses Cardiac: RRR; no murmurs, rubs, or gallops,no edema  Respiratory:  clear to auscultation bilaterally, normal work of breathing GI: soft, nontender, nondistended, + BS MS: no deformity or atrophy Skin: warm and dry, no rash Neuro:  Strength and sensation are intact Psych: euthymic mood, full affect   EKG:  EKG is ordered today. The ekg ordered today demonstrates NSR HR 61   Recent Labs: 01/06/2015: TSH 0.816 01/07/2015: ALT 21 01/08/2015: Magnesium 2.1 01/10/2015: BUN 10; Creatinine, Ser 0.85; Hemoglobin 14.0; Platelets 137*; Potassium 4.3; Sodium 136    Lipid Panel    Component Value Date/Time   CHOL 130 01/07/2015 1000   TRIG 42 01/07/2015 1000   HDL 38* 01/07/2015 1000   CHOLHDL 3.4 01/07/2015 1000   VLDL 8 01/07/2015 1000   LDLCALC 84 01/07/2015 1000      Wt Readings from Last 3 Encounters:  04/30/15 197 lb (89.359 kg)  02/10/15 186 lb (84.369 kg)  02/03/15 186 lb (84.369 kg)      Other studies Reviewed: Additional studies/ records that were reviewed today include: LHC Review of the above records demonstrates: see HPI   ASSESSMENT AND PLAN:  Logan Reynolds is a 50 y.o. male with a history of HTN, tobacco abuse, DMT2 and CAD s/p recent emergent CABG who presents to clinic for post hospital follow up.   CAD/NSTEMI s/p recent emergent CABG x4V -- Continue ASA ?, atorvastatin  daily and lopressor  BID.    Continue ASA 81 a day . We can consider adding plavix if he has any symptoms .   Post op afib: today he remains in NSR.  Off amio.  HTN: continue Lopressor  BID.   Tobacco abuse: he has quit since his hospitalization   Current medicines are reviewed at length with the patient today.  The patient does not have concerns regarding medicines.  The following changes have been made:  no change  Labs/ tests ordered today include:  No orders of the defined types were placed in  this encounter.    Disposition:   FU with me in 6 months   Signed, Nahser, Deloris Ping, MD  04/30/2015 9:28 AM    [facility] [address] [place], [place], [place]  [address] Phone: [phone]; Fax: [phone]

## 2015-04-30 NOTE — Patient Instructions (Signed)
**Note De-Identified Logan Reynolds Obfuscation** Medication Instructions:  Decrease Aspirin to 81 mg daily-all other medications remain the same  Labwork: Today- Lipids and a CMET  Testing/Procedures: None  Follow-Up: Your physician wants you to follow-up in: 6 months. You will receive a reminder letter in the mail two months in advance. If you don't receive a letter, please call our office to schedule the follow-up appointment.     If you need a refill on your cardiac medications before your next appointment, please call your pharmacy.

## 2015-07-03 ENCOUNTER — Other Ambulatory Visit: Payer: Self-pay | Admitting: Cardiovascular Disease

## 2015-10-18 ENCOUNTER — Encounter: Payer: Self-pay | Admitting: Cardiovascular Disease

## 2015-10-29 ENCOUNTER — Encounter (INDEPENDENT_AMBULATORY_CARE_PROVIDER_SITE_OTHER): Payer: Self-pay

## 2015-10-29 ENCOUNTER — Encounter: Payer: Self-pay | Admitting: Cardiovascular Disease

## 2015-10-29 ENCOUNTER — Ambulatory Visit (INDEPENDENT_AMBULATORY_CARE_PROVIDER_SITE_OTHER): Payer: BLUE CROSS/BLUE SHIELD | Admitting: Cardiovascular Disease

## 2015-10-29 VITALS — BP 100/60 | HR 53 | Ht 72.0 in | Wt 203.4 lb

## 2015-10-29 DIAGNOSIS — I2510 Atherosclerotic heart disease of native coronary artery without angina pectoris: Secondary | ICD-10-CM

## 2015-10-29 DIAGNOSIS — E785 Hyperlipidemia, unspecified: Secondary | ICD-10-CM | POA: Diagnosis not present

## 2015-10-29 DIAGNOSIS — I214 Non-ST elevation (NSTEMI) myocardial infarction: Secondary | ICD-10-CM

## 2015-10-29 DIAGNOSIS — I10 Essential (primary) hypertension: Secondary | ICD-10-CM

## 2015-10-29 LAB — COMPREHENSIVE METABOLIC PANEL
ALT: 58 U/L — ABNORMAL HIGH (ref 9–46)
AST: 32 U/L (ref 10–35)
Albumin: 4.5 g/dL (ref 3.6–5.1)
Alkaline Phosphatase: 83 U/L (ref 40–115)
BILIRUBIN TOTAL: 0.7 mg/dL (ref 0.2–1.2)
BUN: 10 mg/dL (ref 7–25)
CALCIUM: 9.1 mg/dL (ref 8.6–10.3)
CHLORIDE: 101 mmol/L (ref 98–110)
CO2: 24 mmol/L (ref 20–31)
CREATININE: 0.98 mg/dL (ref 0.70–1.33)
GLUCOSE: 129 mg/dL — AB (ref 65–99)
Potassium: 4.4 mmol/L (ref 3.5–5.3)
SODIUM: 138 mmol/L (ref 135–146)
Total Protein: 7.4 g/dL (ref 6.1–8.1)

## 2015-10-29 LAB — LIPID PANEL
CHOL/HDL RATIO: 1.9 ratio (ref <=5.0)
Cholesterol: 98 mg/dL — ABNORMAL LOW (ref 125–200)
HDL: 52 mg/dL (ref >=40)
LDL Cholesterol: 24 mg/dL (ref <130)
Triglycerides: 108 mg/dL (ref <150)
VLDL: 22 mg/dL (ref <30)

## 2015-10-29 NOTE — Patient Instructions (Signed)

## 2015-10-29 NOTE — Progress Notes (Signed)
Cardiology Office Note   Date:  10/29/2015   ID:  Logan Reynolds, DOB 09/10/1965, MRN [id number]  PCP:  Kirstie Peri, MD  Cardiologist:  Dr. Elease Hashimoto   Problem List 1. CAD - s/p emergent CABG  2. DM 3. Post op atrial fib 4.   Post-hospital follow up- emergent CABG    Logan Reynolds is a 50 y.o. male with a history of HTN, tobacco abuse, DMT2 and CAD s/p recent emergent CABG who presents to clinic for post hospital follow up.   He presented to [facility] with chest pain in 12/2014. He ruled in for NSTEMI and was transferred to [facility] for further evalation and treatment. He underwent cath 01/07/15 which showed 90% distal LM, 100% proximal to mid LAD with 99% lesion in two diagonals, large OM system without disease and RCA without disease as well as mild LV dysfunction. Due to these findings emergent cardiothoracic surgical consultation was obtained with Evelene Croon M.D. who evaluated the patient and his studies and agreed with recommendations to proceed with emergent coronary artery surgical revascularization. He underwent CABG x4V (LIMA--> LAD, SVG--> D1, SVG--> D2, SVG--> OM) on 01/07/15. His post-op course was complicated by aflutter with RVR. He was given IV Lopressor and he converted to sinus rhythm. He was then put on oral Amiodarone. He has remained in sinus rhythm and is surgically stable for discharge 01/12/15.  April 30, 2015: Pt was seen by Carlean Jews several weeks ago .  Still has some fatigue . 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 20, 2017:  Logan Reynolds is recovering well. Still has chest wall tenderness.  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   Past Medical History:  Diagnosis Date  . Anxiety   . Diabetes mellitus, type 2 (HCC)   . GERD (gastroesophageal reflux disease)   . HTN (hypertension)   . Tobacco  abuse     Past Surgical History:  Procedure Laterality Date  . CARDIAC CATHETERIZATION  2006  . CARDIAC CATHETERIZATION N/A 01/07/2015   Procedure: Left Heart Cath and Coronary Angiography;  Surgeon: Marykay Lex, MD;  Location: [facility] INVASIVE CV LAB;  Service: Cardiovascular;  Laterality: N/A;  . CORONARY ARTERY BYPASS GRAFT N/A 01/07/2015   Procedure: CORONARY ARTERY BYPASS GRAFTING (CABG)x 4 using left internal mammary artery and right saphenous leg vein.;  Surgeon: Alleen Borne, MD;  Location: [facility] OR;  Service: Open Heart Surgery;  Laterality: N/A;  . KNEE ARTHROSCOPY       Current Outpatient Prescriptions  Medication Sig Dispense Refill  . ALPRAZolam (XANAX) 0.5 MG tablet Take 0.5 mg by mouth 3 (three) times daily as needed for anxiety.    Marland Kitchen aspirin 81 MG EC tablet Take 1 tablet (81 mg total) by mouth daily.    Marland Kitchen atorvastatin (LIPITOR) 80 MG tablet TAKE 1 TABLET (80 MG TOTAL) BY MOUTH DAILY AT 6 PM. 30 tablet 9  . lisinopril (PRINIVIL,ZESTRIL) 2.5 MG tablet Take 2.5 mg by mouth daily.    . metFORMIN (GLUCOPHAGE) 500 MG tablet Take 1,000 mg by mouth 2 (two) times daily with a meal.    . metoprolol tartrate (LOPRESSOR) 25 MG tablet Take 1 tablet (25 mg total) by mouth 2 (two) times daily. 60 tablet 1  . omeprazole (PRILOSEC) 20 MG capsule Take 20 mg  by mouth daily.     No current facility-administered medications for this visit.     Allergies:   Review of patient's allergies indicates no known allergies.    Social History:  The patient  reports that he has quit smoking. His smoking use included Cigarettes. He has a 37.50 pack-year smoking history. He has never used smokeless tobacco. He reports that he does not drink alcohol or use drugs.   Family History:  The patient's family history includes Cancer in his father and mother; Diabetes in his brother and maternal grandmother; Heart attack in his father.    ROS:  Please see the history of present illness.   Otherwise, review of  systems are positive for NONE.   All other systems are reviewed and negative.    PHYSICAL EXAM: VS:  BP 100/60   Pulse (!) 53   Ht 6' (1.829 m)   Wt 203 lb 6.4 oz (92.3 kg)   BMI 27.59 kg/m  , BMI Body mass index is 27.59 kg/m. GEN: Well nourished, well developed, in no acute distress  HEENT: normal  Neck: no JVD, carotid bruits, or masses Cardiac: RRR; no murmurs, rubs, or gallops,no edema  Respiratory:  clear to auscultation bilaterally, normal work of breathing GI: soft, nontender, nondistended, + BS MS: no deformity or atrophy  Skin: warm and dry, no rash Neuro:  Strength and sensation are intact Psych: euthymic mood, full affect   EKG:  EKG is ordered today. The ekg ordered today demonstrates  Sinus brady at 53.   NS ST abn.   Recent Labs: 01/06/2015: TSH 0.816 01/08/2015: Magnesium 2.1 01/10/2015: Hemoglobin 14.0; Platelets 137 04/30/2015: ALT 44; BUN 11; Creat 1.01; Potassium 3.8; Sodium 132    Lipid Panel    Component Value Date/Time   CHOL 101 (L) 04/30/2015 0959   TRIG 159 (H) 04/30/2015 0959   HDL 28 (L) 04/30/2015 0959   CHOLHDL 3.6 04/30/2015 0959   VLDL 32 (H) 04/30/2015 0959   LDLCALC 41 04/30/2015 0959      Wt Readings from Last 3 Encounters:  10/29/15 203 lb 6.4 oz (92.3 kg)  04/30/15 197 lb (89.4 kg)  02/10/15 186 lb (84.4 kg)      Other studies Reviewed: Additional studies/ records that were reviewed today include: LHC Review of the above records demonstrates: see HPI   ASSESSMENT AND PLAN:  Logan McgeeDavid Ray Reynolds is a 50 y.o. male with a history of HTN, tobacco abuse, DMT2 and CAD s/p recent emergent CABG who presents to clinic for post hospital follow up.   CAD/NSTEMI s/p recent emergent CABG x4V -- Continue ASA 325mg ?, atorvastatin 80mg  daily and lopressor 25mg  BID.    Continue ASA 81 a day . We can consider adding plavix if he has any symptoms .   Post op afib: today he remains in NSR.  Off amio.  HTN: continue Lopressor 25mg  BID.    Tobacco abuse: he has quit since his hospitalization   Disposition:   FU with me in 6 months   Signed, Kristeen MissPhilip Nahser, MD  10/29/2015 11:09 AM    [facility] [address], [place], [place]  [address] Phone: [phone]) [phone]; Fax: [phone]

## 2016-05-11 ENCOUNTER — Other Ambulatory Visit: Payer: Self-pay | Admitting: *Deleted

## 2016-05-11 MED ORDER — ATORVASTATIN CALCIUM 80 MG PO TABS: ORAL_TABLET | ORAL | 1 refills | Status: DC

## 2016-05-16 ENCOUNTER — Encounter: Payer: Self-pay | Admitting: Cardiovascular Disease

## 2016-06-02 ENCOUNTER — Encounter: Payer: Self-pay | Admitting: Cardiovascular Disease

## 2016-06-02 ENCOUNTER — Encounter (INDEPENDENT_AMBULATORY_CARE_PROVIDER_SITE_OTHER): Payer: Self-pay

## 2016-06-02 ENCOUNTER — Ambulatory Visit (INDEPENDENT_AMBULATORY_CARE_PROVIDER_SITE_OTHER): Payer: BLUE CROSS/BLUE SHIELD | Admitting: Cardiovascular Disease

## 2016-06-02 VITALS — BP 106/64 | HR 75 | Ht 72.0 in | Wt 207.1 lb

## 2016-06-02 DIAGNOSIS — I25119 Atherosclerotic heart disease of native coronary artery with unspecified angina pectoris: Secondary | ICD-10-CM

## 2016-06-02 DIAGNOSIS — I2510 Atherosclerotic heart disease of native coronary artery without angina pectoris: Secondary | ICD-10-CM | POA: Insufficient documentation

## 2016-06-02 LAB — LIPID PANEL
CHOLESTEROL TOTAL: 105 mg/dL (ref 100–199)
Chol/HDL Ratio: 3.9 ratio (ref 0.0–5.0)
HDL: 27 mg/dL — AB (ref >39)
LDL CALC: 34 mg/dL (ref 0–99)
Triglycerides: 219 mg/dL — ABNORMAL HIGH (ref 0–149)
VLDL CHOLESTEROL CAL: 44 mg/dL — AB (ref 5–40)

## 2016-06-02 LAB — COMPREHENSIVE METABOLIC PANEL
ALK PHOS: 76 IU/L (ref 39–117)
ALT: 59 IU/L — AB (ref 0–44)
AST: 37 IU/L (ref 0–40)
Albumin/Globulin Ratio: 1.7 (ref 1.2–2.2)
Albumin: 4.3 g/dL (ref 3.5–5.5)
BILIRUBIN TOTAL: 0.4 mg/dL (ref 0.0–1.2)
BUN/Creatinine Ratio: 11 (ref 9–20)
BUN: 12 mg/dL (ref 6–24)
CHLORIDE: 101 mmol/L (ref 96–106)
CO2: 22 mmol/L (ref 18–29)
CREATININE: 1.1 mg/dL (ref 0.76–1.27)
Calcium: 9.2 mg/dL (ref 8.7–10.2)
GFR calc Af Amer: 88 mL/min/{1.73_m2} (ref >59)
GFR calc non Af Amer: 76 mL/min/{1.73_m2} (ref >59)
GLUCOSE: 238 mg/dL — AB (ref 65–99)
Globulin, Total: 2.5 g/dL (ref 1.5–4.5)
Potassium: 4.3 mmol/L (ref 3.5–5.2)
Sodium: 138 mmol/L (ref 134–144)
Total Protein: 6.8 g/dL (ref 6.0–8.5)

## 2016-06-02 NOTE — Progress Notes (Signed)
Cardiology Office Note   Date:  06/02/2016   ID:  Logan Reynolds, DOB 05-14-1965, MRN [id number]  PCP:  Kirstie Peri, MD  Cardiologist:  Dr. Elease Hashimoto   Problem List 1. CAD - s/p emergent CABG  01-07-15 2. DM 3. Post op atrial fib 4.   Post-hospital follow up- emergent CABG    Logan Reynolds is a 51 y.o. male with a history of HTN, tobacco abuse, DMT2 and CAD s/p recent emergent CABG who presents to clinic for post hospital follow up.   He presented to [facility] with chest pain in 12/2014. He ruled in for NSTEMI and was transferred to [facility] for further evalation and treatment. He underwent cath 01/07/15 which showed 90% distal LM, 100% proximal to mid LAD with 99% lesion in two diagonals, large OM system without disease and RCA without disease as well as mild LV dysfunction. Due to these findings emergent cardiothoracic surgical consultation was obtained with Logan Reynolds M.D. who evaluated the patient and his studies and agreed with recommendations to proceed with emergent coronary artery surgical revascularization. He underwent CABG x4V (LIMA--> LAD, SVG--> D1, SVG--> D2, SVG--> OM) on 01/07/15. His post-op course was complicated by aflutter with RVR. He was given IV Lopressor and he converted to sinus rhythm. He was then put on oral Amiodarone. He has remained in sinus rhythm and is surgically stable for discharge 01/12/15.  April 30, 2015: Pt was seen by Carlean Jews several weeks ago .  Still has some fatigue . 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 20, 2017:  [name] is recovering well. Still has chest wall tenderness.  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   Jun 02, 2016:  Logan Reynolds is seen for follow up of his CAD Diabetes is not well controlled yet Logan Reynolds was started.   No CP or dyspnea.    Wt Readings from  Last 3 Encounters:  06/02/16 207 lb 1.9 oz (93.9 kg)  10/29/15 203 lb 6.4 oz (92.3 kg)  04/30/15 197 lb (89.4 kg)   Is losing some weight   Past Medical History:  Diagnosis Date  . Anxiety   . Diabetes mellitus, type 2 (HCC)   . GERD (gastroesophageal reflux disease)   . HTN (hypertension)   . Tobacco abuse     Past Surgical History:  Procedure Laterality Date  . CARDIAC CATHETERIZATION  2006  . CARDIAC CATHETERIZATION N/A 01/07/2015   Procedure: Left Heart Cath and Coronary Angiography;  Surgeon: Marykay Lex, MD;  Location: [facility] INVASIVE CV LAB;  Service: Cardiovascular;  Laterality: N/A;  . CORONARY ARTERY BYPASS GRAFT N/A 01/07/2015   Procedure: CORONARY ARTERY BYPASS GRAFTING (CABG)x 4 using left internal mammary artery and right saphenous leg vein.;  Surgeon: Alleen Borne, MD;  Location: [facility] OR;  Service: Open Heart Surgery;  Laterality: N/A;  . KNEE ARTHROSCOPY       Current Outpatient Prescriptions  Medication Sig Dispense Refill  . ALPRAZolam (XANAX) 0.5 MG tablet Take 0.5 mg by mouth 3 (three) times daily as needed for anxiety.    Marland Kitchen aspirin 81 MG EC tablet Take 1 tablet (81 mg total) by mouth daily.    Marland Kitchen atorvastatin (LIPITOR) 80 MG tablet TAKE 1 TABLET (80 MG TOTAL) BY MOUTH DAILY AT 6 PM. 90 tablet 1  .  JARDIANCE 10 MG TABS tablet Take 10 mg by mouth daily.  2  . lisinopril (PRINIVIL,ZESTRIL) 2.5 MG tablet Take 2.5 mg by mouth daily.    . meloxicam (MOBIC) 15 MG tablet Take 15 mg by mouth daily.  1  . metFORMIN (GLUCOPHAGE) 500 MG tablet Take 1,000 mg by mouth 2 (two) times daily with a meal.    . metoprolol tartrate (LOPRESSOR) 25 MG tablet Take 1 tablet (25 mg total) by mouth 2 (two) times daily. 60 tablet 1  . omeprazole (PRILOSEC) 40 MG capsule Take 40 mg by mouth daily.  1   No current facility-administered medications for this visit.     Allergies:   Patient has no known allergies.    Social History:  The patient  reports that he has quit smoking. His  smoking use included Cigarettes. He has a 37.50 pack-year smoking history. He has never used smokeless tobacco. He reports that he does not drink alcohol or use drugs.   Family History:  The patient's family history includes Cancer in his father and mother; Diabetes in his brother and maternal grandmother; Heart attack in his father.    ROS:  Please see the history of present illness.   Otherwise, review of systems are positive for NONE.   All other systems are reviewed and negative.    PHYSICAL EXAM: VS:  BP 106/64   Pulse 75   Ht 6' (1.829 m)   Wt 207 lb 1.9 oz (93.9 kg)   BMI 28.09 kg/m  , BMI Body mass index is 28.09 kg/m. GEN: Well nourished, well developed, in no acute distress  HEENT: normal  Neck: no JVD, carotid bruits, or masses Cardiac: RRR; no murmurs, rubs, or gallops,no edema  Respiratory:  clear to auscultation bilaterally, normal work of breathing GI: soft, nontender, nondistended, + BS MS: no deformity or atrophy  Skin: warm and dry, no rash Neuro:  Strength and sensation are intact Psych: euthymic mood, full affect   EKG:  EKG is ordered today. The ekg ordered today demonstrates  Sinus brady at 53.   NS ST abn.   Recent Labs: 10/29/2015: ALT 58; BUN 10; Creat 0.98; Potassium 4.4; Sodium 138    Lipid Panel    Component Value Date/Time   CHOL 98 (L) 10/29/2015 1129   TRIG 108 10/29/2015 1129   HDL 52 10/29/2015 1129   CHOLHDL 1.9 10/29/2015 1129   VLDL 22 10/29/2015 1129   LDLCALC 24 10/29/2015 1129      Wt Readings from Last 3 Encounters:  06/02/16 207 lb 1.9 oz (93.9 kg)  10/29/15 203 lb 6.4 oz (92.3 kg)  04/30/15 197 lb (89.4 kg)      Other studies Reviewed: Additional studies/ records that were reviewed today include: LHC Review of the above records demonstrates: see HPI   ASSESSMENT AND PLAN:  Logan Reynolds is a 51 y.o. male with a history of HTN, tobacco abuse, DMT2 and CAD s/p recent emergent CABG who presents to clinic for post  hospital follow up.   CAD/NSTEMI s/p recent emergent CABG x4V Cont. ASA 81 mg a day  Atorvastatin    Post op afib: today he remains in NSR.  Off amio.  HTN: continue Lopressor 25mg  BID.   Tobacco abuse: he has quit since his hospitalization   Disposition:   FU with me in 6 months   Signed, Kristeen MissPhilip Markeisha Mancias, MD  06/02/2016 11:05 AM    [facility] [address], [place],  [place]  [address] Phone: [phone]; Fax: [phone]

## 2016-06-02 NOTE — Patient Instructions (Addendum)
Medication Instructions:    Your physician recommends that you continue on your current medications as directed. Please refer to the Current Medication list given to you today.  - If you need a refill on your cardiac medications before your next appointment, please call your pharmacy.   Labwork:  Your physician recommends that you have lab work today: CMET, Lipids    Testing/Procedures:  None ordered  Follow-Up:  Your physician recommends that you schedule a follow-up appointment in: 6 months with Dr. Elease HashimotoNahser.    Thank you for choosing [facility]!!   Any Other Special Instructions Will Be Listed Below (If Applicable).

## 2016-11-02 ENCOUNTER — Other Ambulatory Visit: Payer: Self-pay | Admitting: Cardiovascular Disease

## 2016-12-15 ENCOUNTER — Encounter: Payer: Self-pay | Admitting: Cardiovascular Disease

## 2016-12-15 ENCOUNTER — Ambulatory Visit: Payer: BLUE CROSS/BLUE SHIELD | Admitting: Cardiovascular Disease

## 2016-12-15 VITALS — BP 130/70 | HR 64 | Ht 72.0 in | Wt 205.0 lb

## 2016-12-15 DIAGNOSIS — I2510 Atherosclerotic heart disease of native coronary artery without angina pectoris: Secondary | ICD-10-CM

## 2016-12-15 DIAGNOSIS — E782 Mixed hyperlipidemia: Secondary | ICD-10-CM

## 2016-12-15 LAB — HEPATIC FUNCTION PANEL
ALK PHOS: 80 IU/L (ref 39–117)
ALT: 71 IU/L — ABNORMAL HIGH (ref 0–44)
AST: 47 IU/L — ABNORMAL HIGH (ref 0–40)
Albumin: 4.8 g/dL (ref 3.5–5.5)
BILIRUBIN TOTAL: 0.9 mg/dL (ref 0.0–1.2)
BILIRUBIN, DIRECT: 0.26 mg/dL (ref 0.00–0.40)
TOTAL PROTEIN: 7.1 g/dL (ref 6.0–8.5)

## 2016-12-15 LAB — BASIC METABOLIC PANEL
BUN / CREAT RATIO: 14 (ref 9–20)
BUN: 17 mg/dL (ref 6–24)
CO2: 23 mmol/L (ref 20–29)
CREATININE: 1.24 mg/dL (ref 0.76–1.27)
Calcium: 9.4 mg/dL (ref 8.7–10.2)
Chloride: 98 mmol/L (ref 96–106)
GFR calc Af Amer: 76 mL/min/{1.73_m2} (ref >59)
GFR calc non Af Amer: 66 mL/min/{1.73_m2} (ref >59)
GLUCOSE: 290 mg/dL — AB (ref 65–99)
Potassium: 4.7 mmol/L (ref 3.5–5.2)
Sodium: 136 mmol/L (ref 134–144)

## 2016-12-15 LAB — LIPID PANEL
Chol/HDL Ratio: 3.6 ratio (ref 0.0–5.0)
Cholesterol, Total: 114 mg/dL (ref 100–199)
HDL: 32 mg/dL — AB (ref >39)
LDL Calculated: 58 mg/dL (ref 0–99)
Triglycerides: 119 mg/dL (ref 0–149)
VLDL Cholesterol Cal: 24 mg/dL (ref 5–40)

## 2016-12-15 NOTE — Patient Instructions (Signed)

## 2016-12-15 NOTE — Progress Notes (Signed)
Cardiology Office Note   Date:  12/15/2016   ID:  Logan Reynolds, DOB [birth year], MRN [id number]  PCP:  Kirstie Peri, MD  Cardiologist:  Dr. Elease Hashimoto   Problem List 1. CAD - s/p emergent CABG  01-07-15 2. DM 3. Post op atrial fib 4.   Post-hospital follow up- emergent CABG    Logan Reynolds is a 51 y.o. male with a history of HTN, tobacco abuse, DMT2 and CAD s/p recent emergent CABG who presents to clinic for post hospital follow up.   He presented to [facility] with chest pain in 12/2014. He ruled in for NSTEMI and was transferred to [facility] for further evalation and treatment. He underwent cath 01/07/15 which showed 90% distal LM, 100% proximal to mid LAD with 99% lesion in two diagonals, large OM system without disease and RCA without disease as well as mild LV dysfunction. Due to these findings emergent cardiothoracic surgical consultation was obtained with Evelene Croon M.D. who evaluated the patient and his studies and agreed with recommendations to proceed with emergent coronary artery surgical revascularization. He underwent CABG x4V (LIMA--> LAD, SVG--> D1, SVG--> D2, SVG--> OM) on 01/07/15. His post-op course was complicated by aflutter with RVR. He was given IV Lopressor and he converted to sinus rhythm. He was then put on oral Amiodarone. He has remained in sinus rhythm and is surgically stable for discharge 01/12/15.  April 30, 2015: Pt was seen by Carlean Jews several weeks ago .  Still has some fatigue . 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 20, 2017:  Logan Reynolds is recovering well. Still has chest wall tenderness.  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   Jun 02, 2016:  Logan Reynolds is seen for follow up of his CAD Diabetes is not well controlled yet [name] was started.   No CP or dyspnea.    Dec. 7,  2018:  Logan Reynolds is doing well.  He it has been 2 years since his bypass surgery.  He stopped smoking at that time.  He still working on getting his glucose levels under control. He is very active at his work.  He works as an Personnel officer.  He does not get much aerobic exercise.  He admits that he has a treadmill at home that he does not use.   Wt Readings from Last 3 Encounters:  12/15/16 205 lb (93 kg)  06/02/16 207 lb 1.9 oz (93.9 kg)  10/29/15 203 lb 6.4 oz (92.3 kg)   Is losing some weight   Past Medical History:  Diagnosis Date  . Anxiety   . Diabetes mellitus, type 2 (HCC)   . GERD (gastroesophageal reflux disease)   . HTN (hypertension)   . Tobacco abuse     Past Surgical History:  Procedure Laterality Date  . CARDIAC CATHETERIZATION  2006  . CARDIAC CATHETERIZATION N/A 01/07/2015   Procedure: Left Heart Cath and Coronary Angiography;  Surgeon: Marykay Lex, MD;  Location: [facility] INVASIVE CV LAB;  Service: Cardiovascular;  Laterality: N/A;  . CORONARY ARTERY BYPASS GRAFT N/A 01/07/2015   Procedure: CORONARY ARTERY BYPASS GRAFTING (CABG)x 4 using left internal mammary artery and right saphenous leg vein.;  Surgeon: Alleen Borne, MD;  Location: [facility] OR;  Service: Open Heart Surgery;  Laterality: N/A;  . KNEE ARTHROSCOPY  Current Outpatient Medications  Medication Sig Dispense Refill  . ALPRAZolam (XANAX) 0.5 MG tablet Take 0.5 mg by mouth 3 (three) times daily as needed for anxiety.    Marland Kitchen. aspirin 81 MG EC tablet Take 1 tablet (81 mg total) by mouth daily.    Marland Kitchen. atorvastatin (LIPITOR) 80 MG tablet TAKE 1 TABLET (80 MG TOTAL) BY MOUTH DAILY AT 6 PM. 90 tablet 1  . JARDIANCE 10 MG TABS tablet Take 10 mg by mouth daily.  2  . lisinopril (PRINIVIL,ZESTRIL) 2.5 MG tablet Take 2.5 mg by mouth daily.    . meloxicam (MOBIC) 15 MG tablet Take 15 mg by mouth daily.  1  . metFORMIN (GLUCOPHAGE) 500 MG tablet Take 1,000 mg by mouth 2 (two) times daily with a meal.    . metoprolol  tartrate (LOPRESSOR) 25 MG tablet Take 1 tablet (25 mg total) by mouth 2 (two) times daily. 60 tablet 1  . omeprazole (PRILOSEC) 40 MG capsule Take 40 mg by mouth daily.  1   No current facility-administered medications for this visit.     Allergies:   Patient has no known allergies.    Social History:  The patient  reports that he has quit smoking. His smoking use included cigarettes. He has a 37.50 pack-year smoking history. he has never used smokeless tobacco. He reports that he does not drink alcohol or use drugs.   Family History:  The patient's family history includes Cancer in his father and mother; Diabetes in his brother and maternal grandmother; Heart attack in his father.    ROS:  Please see the history of present illness.   Otherwise, review of systems are positive for NONE.   All other systems are reviewed and negative.   Physical Exam: Blood pressure 130/70, pulse 64, height 6' (1.829 m), weight 205 lb (93 kg), SpO2 94 %.  GEN:  Well nourished, well developed in no acute distress HEENT: Normal NECK: No JVD; No carotid bruits LYMPHATICS: No lymphadenopathy CARDIAC: RR, no murmurs, rubs, gallops RESPIRATORY:  Clear to auscultation without rales, wheezing or rhonchi  ABDOMEN: Soft, non-tender, non-distended MUSCULOSKELETAL:  No edema; No deformity  SKIN: Warm and dry NEUROLOGIC:  Alert and oriented x 3   EKG:  EKG is ordered today. The ekg ordered today demonstrates  Sinus brady at 53.   NS ST abn.   Recent Labs: 06/02/2016: ALT 59; BUN 12; Creatinine, Ser 1.10; Potassium 4.3; Sodium 138    Lipid Panel    Component Value Date/Time   CHOL 105 06/02/2016 1131   TRIG 219 (H) 06/02/2016 1131   HDL 27 (L) 06/02/2016 1131   CHOLHDL 3.9 06/02/2016 1131   CHOLHDL 1.9 10/29/2015 1129   VLDL 22 10/29/2015 1129   LDLCALC 34 06/02/2016 1131      Wt Readings from Last 3 Encounters:  12/15/16 205 lb (93 kg)  06/02/16 207 lb 1.9 oz (93.9 kg)  10/29/15 203 lb 6.4 oz  (92.3 kg)      Other studies Reviewed: Additional studies/ records that were reviewed today include: LHC Review of the above records demonstrates: see HPI   ASSESSMENT AND PLAN:  Logan McgeeDavid Ray Reynolds is a 51 y.o. male with a history of HTN, tobacco abuse, DMT2 and CAD s/p recent emergent CABG who presents to clinic for post hospital follow up.   CAD/NSTEMI s/p recent emergent CABG x4V Doing well.  No angina .   Hyperlipidemia: Continue atorvastatin ,  Check labs today   Post op afib:  remains in NSR   HTN: BP is well controlled.   Tobacco abuse: has quit smoking .   Disposition:   FU with me in 6 months   Signed, Kristeen MissPhilip Emmery Seiler, MD  12/15/2016 10:57 AM    [facility] [address], [place], [place]  [address] Phone: [phone](336) [phone]; Fax: [phone](336) [phone]

## 2016-12-20 ENCOUNTER — Telehealth: Payer: Self-pay | Admitting: Nurse Practitioner

## 2016-12-20 DIAGNOSIS — E781 Pure hyperglyceridemia: Secondary | ICD-10-CM

## 2016-12-20 DIAGNOSIS — E782 Mixed hyperlipidemia: Secondary | ICD-10-CM

## 2016-12-20 MED ORDER — ROSUVASTATIN CALCIUM 40 MG PO TABS: 40.0000 mg | ORAL_TABLET | Freq: Every day | ORAL | 3 refills | Status: DC

## 2016-12-20 NOTE — Telephone Encounter (Signed)
-----   Message from Vesta MixerPhilip J Nahser, MD sent at 12/20/2016  7:28 AM EST ----- Lipids look good Glucose is elevated.   He should contact his primary MD for that LFTs are elevated. Is he drinking more alcohol? We may need to DC and switch to PCSK-9 inhibitor

## 2016-12-20 NOTE — Telephone Encounter (Signed)
Reviewed lab results with patient. He denies increased alcohol intake and states he only takes 2 Tylenol as needed, denies that this is frequent. I explained the increase in his liver enzymes and that Dr. Elease HashimotoNahser and Margaretmary DysMegan Supple, [facility] want to d/c atorvastatin and start rosuvastatin 40 mg once daily. He is in agreement with this plan. I scheduled him to have repeat fasting lab work on 02/23/17. I advised him to call back sooner with questions or concerns. He asked me to forward copies of lab work to Dr. Sherryll BurgerShah and I agreed. Patient thanked me for the call.

## 2017-02-23 ENCOUNTER — Other Ambulatory Visit: Payer: BLUE CROSS/BLUE SHIELD | Admitting: *Deleted

## 2017-02-23 DIAGNOSIS — E782 Mixed hyperlipidemia: Secondary | ICD-10-CM

## 2017-02-23 DIAGNOSIS — E781 Pure hyperglyceridemia: Secondary | ICD-10-CM

## 2017-02-23 LAB — HEPATIC FUNCTION PANEL
ALK PHOS: 69 IU/L (ref 39–117)
ALT: 46 IU/L — ABNORMAL HIGH (ref 0–44)
AST: 24 IU/L (ref 0–40)
Albumin: 4.9 g/dL (ref 3.5–5.5)
BILIRUBIN, DIRECT: 0.17 mg/dL (ref 0.00–0.40)
Bilirubin Total: 0.4 mg/dL (ref 0.0–1.2)
Total Protein: 7.6 g/dL (ref 6.0–8.5)

## 2017-02-23 LAB — LIPID PANEL
CHOLESTEROL TOTAL: 95 mg/dL — AB (ref 100–199)
Chol/HDL Ratio: 2.9 ratio (ref 0.0–5.0)
HDL: 33 mg/dL — AB (ref >39)
LDL Calculated: 43 mg/dL (ref 0–99)
TRIGLYCERIDES: 96 mg/dL (ref 0–149)
VLDL Cholesterol Cal: 19 mg/dL (ref 5–40)

## 2017-07-27 ENCOUNTER — Encounter: Payer: Self-pay | Admitting: Cardiovascular Disease

## 2017-07-27 ENCOUNTER — Ambulatory Visit: Payer: BLUE CROSS/BLUE SHIELD | Admitting: Cardiovascular Disease

## 2017-07-27 VITALS — BP 98/56 | HR 63 | Ht 72.0 in | Wt 202.8 lb

## 2017-07-27 DIAGNOSIS — E782 Mixed hyperlipidemia: Secondary | ICD-10-CM | POA: Diagnosis not present

## 2017-07-27 DIAGNOSIS — I25119 Atherosclerotic heart disease of native coronary artery with unspecified angina pectoris: Secondary | ICD-10-CM | POA: Diagnosis not present

## 2017-07-27 NOTE — Progress Notes (Signed)
Cardiology Office Note   Date:  07/27/2017   ID:  Logan Reynolds, DOB [birth year], MRN [id number]  PCP:  Kirstie Peri, MD  Cardiologist:  Dr. Elease Hashimoto   Problem List 1. CAD - s/p emergent CABG  01-07-15 2. DM 3. Post op atrial fib 4.   Post-hospital follow up- emergent CABG    Logan Reynolds is a 52 y.o. male with a history of HTN, tobacco abuse, DMT2 and CAD s/p recent emergent CABG who presents to clinic for post hospital follow up.   He presented to [facility] with chest pain in 12/2014. He ruled in for NSTEMI and was transferred to [facility] for further evalation and treatment. He underwent cath 01/07/15 which showed 90% distal LM, 100% proximal to mid LAD with 99% lesion in two diagonals, large OM system without disease and RCA without disease as well as mild LV dysfunction. Due to these findings emergent cardiothoracic surgical consultation was obtained with Evelene Croon M.D. who evaluated the patient and his studies and agreed with recommendations to proceed with emergent coronary artery surgical revascularization. He underwent CABG x4V (LIMA--> LAD, SVG--> D1, SVG--> D2, SVG--> OM) on 01/07/15. His post-op course was complicated by aflutter with RVR. He was given IV Lopressor and he converted to sinus rhythm. He was then put on oral Amiodarone. He has remained in sinus rhythm and is surgically stable for discharge 01/12/15.  April 30, 2015: Pt was seen by Carlean Jews several weeks ago .  Still has some fatigue . 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 20, 2017:  Ender is recovering well. Still has chest wall tenderness.  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   Jun 02, 2016:  Logan Reynolds is seen for follow up of his CAD Diabetes is not well controlled yet [name] was started.   No CP or dyspnea.    Dec. 7,  2018:  Logan Reynolds is doing well.  He it has been 2 years since his bypass surgery.  He stopped smoking at that time.  He still working on getting his glucose levels under control. He is very active at his work.  He works as an Personnel officer.  He does not get much aerobic exercise.  He admits that he has a treadmill at home that he does not use.  July 27, 2017:  Logan Reynolds is doing well.  Seen with wife, Logan Reynolds .    Working hard without limitations -  Having some left chest skin numbness.   Was on Lipitor - caused some liver enz elevation Tolerating the crestor 40  - liver enz look great.  Has leg cramping when he is working out in the heat .   Wt Readings from Last 3 Encounters:  07/27/17 202 lb 12.8 oz (92 kg)  12/15/16 205 lb (93 kg)  06/02/16 207 lb 1.9 oz (93.9 kg)      Past Medical History:  Diagnosis Date  . Anxiety   . Diabetes mellitus, type 2 (HCC)   . GERD (gastroesophageal reflux disease)   . HTN (hypertension)   . Tobacco abuse     Past Surgical History:  Procedure Laterality Date  . CARDIAC CATHETERIZATION  2006  . CARDIAC CATHETERIZATION N/A 01/07/2015   Procedure: Left Heart Cath and Coronary Angiography;  Surgeon: Marykay Lex, MD;  Location: [facility] INVASIVE CV LAB;  Service: Cardiovascular;  Laterality: N/A;  . CORONARY ARTERY BYPASS GRAFT N/A 01/07/2015   Procedure: CORONARY ARTERY BYPASS GRAFTING (CABG)x 4 using left internal mammary artery and right saphenous leg vein.;  Surgeon: Alleen Borne, MD;  Location: [facility] OR;  Service: Open Heart Surgery;  Laterality: N/A;  . KNEE ARTHROSCOPY       Current Outpatient Medications  Medication Sig Dispense Refill  . ALPRAZolam (XANAX) 0.5 MG tablet Take 0.5 mg by mouth 3 (three) times daily as needed for anxiety.    Marland Kitchen aspirin 81 MG EC tablet Take 1 tablet (81 mg total) by mouth daily.    Marland Kitchen JARDIANCE 25 MG TABS tablet Take 25 mg by mouth daily.  2  . lisinopril (PRINIVIL,ZESTRIL) 2.5 MG tablet Take 2.5 mg by mouth daily.    .  meloxicam (MOBIC) 15 MG tablet Take 15 mg by mouth daily.  1  . metFORMIN (GLUCOPHAGE) 500 MG tablet Take 1,000 mg by mouth 2 (two) times daily with a meal.    . metoprolol tartrate (LOPRESSOR) 25 MG tablet Take 1 tablet (25 mg total) by mouth 2 (two) times daily. 60 tablet 1  . omeprazole (PRILOSEC) 40 MG capsule Take 40 mg by mouth daily.  1  . OZEMPIC 0.25 or 0.5 MG/DOSE SOPN Inject 1 Dose as directed once a week.    . rosuvastatin (CRESTOR) 40 MG tablet Take 1 tablet (40 mg total) by mouth daily. 90 tablet 3   No current facility-administered medications for this visit.     Allergies:   Patient has no known allergies.    Social History:  The patient  reports that he has quit smoking. His smoking use included cigarettes. He has a 37.50 pack-year smoking history. He has never used smokeless tobacco. He reports that he does not drink alcohol or use drugs.   Family History:  The patient's family history includes Cancer in his father and mother; Diabetes in his brother and maternal grandmother; Heart attack in his father.    ROS:  Please see the history of present illness.   Otherwise, review of systems are positive for NONE.   All other systems are reviewed and negative.   Physical Exam: Blood pressure (!) 98/56, pulse 63, height 6' (1.829 m), weight 202 lb 12.8 oz (92 kg), SpO2 96 %.  GEN:  Well nourished, well developed in no acute distress HEENT: Normal NECK: No JVD; No carotid bruits LYMPHATICS: No lymphadenopathy CARDIAC: RR  RESPIRATORY:  Clear to auscultation without rales, wheezing or rhonchi  ABDOMEN: Soft, non-tender, non-distended MUSCULOSKELETAL:  No edema; No deformity  SKIN: Warm and dry NEUROLOGIC:  Alert and oriented x 3   EKG: July 27, 2017: Normal sinus rhythm at 63.  Nonspecific ST and T wave abnormalities.  Recent Labs: 12/15/2016: BUN 17; Creatinine, Ser 1.24; Potassium 4.7; Sodium 136 02/23/2017: ALT 46    Lipid Panel    Component Value Date/Time    CHOL 95 (L) 02/23/2017 0850   TRIG 96 02/23/2017 0850   HDL 33 (L) 02/23/2017 0850   CHOLHDL 2.9 02/23/2017 0850   CHOLHDL 1.9 10/29/2015 1129   VLDL 22 10/29/2015 1129   LDLCALC 43 02/23/2017 0850      Wt Readings from Last 3 Encounters:  07/27/17 202 lb 12.8 oz (92 kg)  12/15/16 205 lb (93 kg)  06/02/16 207 lb 1.9 oz (93.9 kg)      Other studies Reviewed: Additional studies/ records that were reviewed today  include: LHC Review of the above records demonstrates: see HPI   ASSESSMENT AND PLAN:  Logan McgeeDavid Ray Wenner is a 52 y.o. y.o. male with a history of HTN, tobacco abuse, DMT2 and CAD s/p recent emergent CABG who presents to clinic for post hospital follow up.   CAD/NSTEMI s/p recent emergent CABG x4V  . No angina ,   Doing well   Hyperlipidemia:  Lipids look great  Post op afib:   No further episodes   HTN:  BP is actually low ,   Has some leg cramps,   Recommended he drink a V8 each day  Tobacco abuse: has quit smoking .       Signed, Kristeen MissPhilip Cortlynn Hollinsworth, MD  07/27/2017 9:31 AM    [facility] [address], [place], [place]  [address] Phone: [phone](336) [phone]; Fax: [phone](336) [phone]

## 2017-07-27 NOTE — Patient Instructions (Addendum)
Medication Instructions:  Your physician recommends that you continue on your current medications as directed. Please refer to the Current Medication list given to you today.   Labwork: Your physician recommends that you return for lab work in: 6 months on Tuesday January 29, 2018 You will need to FAST for this appointment - nothing to eat or drink after midnight the night before except water.   Testing/Procedures: None Ordered   Follow-Up: Your physician wants you to follow-up in: 1 year with  Dr. Elease HashimotoNahser. You will receive a reminder letter in the mail two months in advance. If you don't receive a letter, please call our office to schedule the follow-up appointment.   If you need a refill on your cardiac medications before your next appointment, please call your pharmacy.   Thank you for choosing [facility]! Eligha BridegroomMichelle Swinyer, RN [phone]

## 2017-12-10 ENCOUNTER — Telehealth: Payer: Self-pay | Admitting: Cardiovascular Disease

## 2017-12-10 DIAGNOSIS — I745 Embolism and thrombosis of iliac artery: Secondary | ICD-10-CM

## 2017-12-10 NOTE — Telephone Encounter (Signed)
Dr. Elease HashimotoNahser in agreement to send referral to [facility]  Called patient to advise [facility]

## 2017-12-10 NOTE — Telephone Encounter (Signed)
Spoke with patient's wife who states patient went to ER in [place] this weekend for stomach pain. 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 Ultrasound done to check pulses in both legs which were normal. She states [facility] [place] [facility] but would like to see someone in the [place] system. Will likely need cholecystectomy but needs this issue addressed prior to surgery. I advised that I will send referral to [facility] and that their office will call patient to schedule. I advised her to call back with questions or concerns.

## 2017-12-10 NOTE — Telephone Encounter (Signed)
  Patient went to ER last night for stomach pain and had a CT done. Mr Logan Reynolds was told that he has aortic blockage and needs to see a cardiovascular surgeon. [facility] but would like to know if this is something that Dr Elease HashimotoNahser can see him for or does he need to [facility].

## 2017-12-11 ENCOUNTER — Other Ambulatory Visit: Payer: Self-pay

## 2017-12-11 ENCOUNTER — Ambulatory Visit: Payer: BLUE CROSS/BLUE SHIELD | Admitting: Vascular Surgery

## 2017-12-11 ENCOUNTER — Other Ambulatory Visit: Payer: Self-pay | Admitting: *Deleted

## 2017-12-11 ENCOUNTER — Encounter: Payer: Self-pay | Admitting: Vascular Surgery

## 2017-12-11 DIAGNOSIS — I7409 Other arterial embolism and thrombosis of abdominal aorta: Secondary | ICD-10-CM | POA: Diagnosis not present

## 2017-12-11 DIAGNOSIS — K8011 Calculus of gallbladder with chronic cholecystitis with obstruction: Secondary | ICD-10-CM

## 2017-12-11 HISTORY — DX: Other arterial embolism and thrombosis of abdominal aorta: I74.09

## 2017-12-11 NOTE — Progress Notes (Signed)
Patient name: Logan Reynolds MRN: [id number] DOB: [birth year] Sex: male  REASON FOR CONSULT: Aortoiliac occlusion  HPI: Logan Reynolds is a 52 y.o. male, with history of hypertension, HLD, tobacco abuse, DM II, CAD s/p NSTEMI in 2016 that presents as a referral for evaluation of aortoiliac occlusion noted on CT yesterday at  [facility] ED during work-up for right upper quadrant abdominal pain.  Patient states he initially presented to the ED yesterday with right upper quadrant abdominal pain that initially started on Thanksgiving on Thursday.  He subsequently states the pain was initially postprandial but then resolved and then he had a recurrent episode on Sunday also post prandial that prompted his visit to the ED.  During the ED visit a CT abdomen pelvis was obtained where they noted an aortic occlusion just below his renal arteries with reconstitution of his common femorals and a referral to vascular surgery was placed after discussing the case with the vascular surgeon on call at [facility].  Patient ultimately wanted to stay within the [facility] health system since the patients cardiologist is Dr. Elease Hashimoto.     On evaluation today he denies any lower extremity pain, paresthesia, or weakness.  He does endorse a history of claudication in his bilateral calves that has been ongoing for years.  He works as an Personnel officer and states he can walk about 1/8 of a mile before his symptoms start in addition states symptoms are worse walking upstairs and can usually walk up to 30 or 40 stairs before symptoms began.   No previous abdominal surgery.  Smokes >1 PPD.  Past Medical History:  Diagnosis Date  . Anxiety   . Diabetes mellitus, type 2 (HCC)   . GERD (gastroesophageal reflux disease)   . HTN (hypertension)   . Tobacco abuse     Past Surgical History:  Procedure Laterality Date  . CARDIAC CATHETERIZATION  2006  . CARDIAC CATHETERIZATION N/A 01/07/2015   Procedure: Left Heart Cath and  Coronary Angiography;  Surgeon: Marykay Lex, MD;  Location: [facility] INVASIVE CV LAB;  Service: Cardiovascular;  Laterality: N/A;  . CORONARY ARTERY BYPASS GRAFT N/A 01/07/2015   Procedure: CORONARY ARTERY BYPASS GRAFTING (CABG)x 4 using left internal mammary artery and right saphenous leg vein.;  Surgeon: Alleen Borne, MD;  Location: [facility] OR;  Service: Open Heart Surgery;  Laterality: N/A;  . KNEE ARTHROSCOPY      Family History  Problem Relation Age of Onset  . Cancer Mother        ovarian  . Cancer Father        bone cancer  . Heart attack Father        CABG x5V  . Heart disease Father   . Diabetes Maternal Grandmother   . Diabetes Brother     SOCIAL HISTORY: Social History   Socioeconomic History  . Marital status: Married    Spouse name: Not on file  . Number of children: Not on file  . Years of education: Not on file  . Highest education level: Not on file  Occupational History  . Not on file  Social Needs  . Financial resource strain: Not on file  . Food insecurity:    Worry: Not on file    Inability: Not on file  . Transportation needs:    Medical: Not on file    Non-medical: Not on file  Tobacco Use  . Smoking status: Former Smoker    Packs/day: 1.50  Years: 25.00    Pack years: 37.50    Types: Cigarettes  . Smokeless tobacco: Never Used  Substance and Sexual Activity  . Alcohol use: No    Alcohol/week: 0.0 standard drinks    Comment: quit about 15 years ago  . Drug use: No  . Sexual activity: Not on file  Lifestyle  . Physical activity:    Days per week: Not on file    Minutes per session: Not on file  . Stress: Not on file  Relationships  . Social connections:    Talks on phone: Not on file    Gets together: Not on file    Attends religious service: Not on file    Active member of club or organization: Not on file    Attends meetings of clubs or organizations: Not on file    Relationship status: Not on file  . Intimate partner violence:     Fear of current or ex partner: Not on file    Emotionally abused: Not on file    Physically abused: Not on file    Forced sexual activity: Not on file  Other Topics Concern  . Not on file  Social History Narrative  . Not on file    No Known Allergies  Current Outpatient Medications  Medication Sig Dispense Refill  . ALPRAZolam (XANAX) 0.5 MG tablet Take 0.5 mg by mouth 3 (three) times daily as needed for anxiety.    Marland Kitchen aspirin 81 MG EC tablet Take 1 tablet (81 mg total) by mouth daily. (Patient taking differently: Take 162 mg by mouth daily. )    . cephALEXin (KEFLEX) 500 MG capsule     . JARDIANCE 25 MG TABS tablet Take 25 mg by mouth daily.  2  . lisinopril (PRINIVIL,ZESTRIL) 2.5 MG tablet Take 2.5 mg by mouth daily.    . meloxicam (MOBIC) 15 MG tablet Take 15 mg by mouth daily.  1  . metFORMIN (GLUCOPHAGE) 500 MG tablet Take 1,000 mg by mouth 2 (two) times daily with a meal.    . metoprolol tartrate (LOPRESSOR) 25 MG tablet Take 1 tablet (25 mg total) by mouth 2 (two) times daily. 60 tablet 1  . omeprazole (PRILOSEC) 40 MG capsule Take 40 mg by mouth daily.  1  . oxyCODONE-acetaminophen (PERCOCET/ROXICET) 5-325 MG tablet     . OZEMPIC 0.25 or 0.5 MG/DOSE SOPN Inject 1 Dose as directed once a week.    . rosuvastatin (CRESTOR) 40 MG tablet Take 1 tablet (40 mg total) by mouth daily. 90 tablet 3   No current facility-administered medications for this visit.     REVIEW OF SYSTEMS:  [X]  denotes positive finding, [ ]  denotes negative finding Cardiac  Comments:  Chest pain or chest pressure:    Shortness of breath upon exertion:    Short of breath when lying flat:    Irregular heart rhythm:        Vascular    Pain in calf, thigh, or hip brought on by ambulation: x   Pain in feet at night that wakes you up from your sleep:     Blood clot in your veins:    Leg swelling:         Pulmonary    Oxygen at home:    Productive cough:     Wheezing:         Neurologic    Sudden  weakness in arms or legs:     Sudden numbness in arms or legs:  Sudden onset of difficulty speaking or slurred speech:    Temporary loss of vision in one eye:     Problems with dizziness:         Gastrointestinal    Blood in stool:     Vomited blood:     Right upper quadrant abdominal pain x   Genitourinary    Burning when urinating:     Blood in urine:        Psychiatric    Major depression:         Hematologic    Bleeding problems:    Problems with blood clotting too easily:        Skin    Rashes or ulcers:        Constitutional    Fever or chills:      PHYSICAL EXAM: Vitals:   12/11/17 1248  BP: (!) 89/61  Pulse: 80  Resp: 18  SpO2: 96%  Weight: 198 lb (89.8 kg)  Height: 6' (1.829 m)    GENERAL: The patient is a well-nourished male, in no acute distress. The vital signs are documented above. CARDIAC: There is a regular rate and rhythm.  VASCULAR:  2+ palpable radial pulse bilateral upper extremities No palpable femoral pulses Monophasic PT signals bilateral lower extremities No tissue loss or motor or sensory loss to either lower extremity PULMONARY: There is good air exchange bilaterally without wheezing or rales. ABDOMEN: Soft and non-tender with normal pitched bowel sounds.  MUSCULOSKELETAL: There are no major deformities or cyanosis. NEUROLOGIC: No focal weakness or paresthesias are detected. SKIN: There are no ulcers or rashes noted. PSYCHIATRIC: The patient has a normal affect.  DATA:   I independently reviewed his CT abdomen and pelvis which shows an occlusion of his infrarenal aorta with occlusion of the bilateral common iliac arteries that are fairly calcified and reconstitution of bilateral common femoral arteries.  Celiac and SMA appear patent.  Assessment/Plan:  52 year old male who presents for evaluation of what appears to be a chronic infrarenal aortic occlusion in the setting of intermittent lower extremity claudication.  Patient  initially presented to the ED at [facility] yesterday for postprandial right upper quadrant abdominal pain and during this work-up a CT abdomen pelvis noted his infra-renal aorta was occluded.  Patient has had fairly stable intermittent claudication symptoms over the last several years that are unchanged today.  He endorses bilateral lower externally claudication in both of his calves that typically is worse when walking up stairs but states he can walk 1/8 of a mile and up to 40 stairs before having significant symptoms.  I discussed with him the benefits of an aortobifemoral bypass after review of the CT scan to alleviate his claudication symptoms especially if he feels they are lifestyle limiting as an Personnel officer.  However, patient's most pressing issue is his abdominal pain and the ER felt this was consistent with biliary colic.  His CT did suggest hazy appearance around gallbladder.  Patient states he had an [place] of gallbladder, but I do not have a report of this.  I will make a referral to [facility] surgery here for further evaluation of biliary colic and discussed that I would favor having his gallbladder removed first if deemed necessary prior to any aortobifemoral bypass given the need for prosthetic graft.  We further discussed that given no evidence of tissue loss or rest pain this is not an immediate limb threatening situation.  The decision to move forward with an aortobifemoral bypass should  be when he feels his symptoms are severe enough.  Spent a long time discussing the nature of the operation as well as the recovery and risk and benefits.  Him and his wife seem to have a fairly good understanding.  Ultimately I think he wants to be evaluated by a general surgeon first and then we will follow-up again after that to reevaluate his symptoms and make a decision moving forward.  I will schedule him to return in one month.     Cephus Shellinghristopher J. Danean Marner, MD [facility]  [place] Office: [phone] Pager: [phone]   Cephus Shellinghristopher J Daliah Chaudoin

## 2017-12-13 ENCOUNTER — Telehealth: Payer: Self-pay

## 2017-12-13 ENCOUNTER — Other Ambulatory Visit: Payer: Self-pay | Admitting: General Surgery

## 2017-12-13 ENCOUNTER — Other Ambulatory Visit (HOSPITAL_COMMUNITY): Payer: Self-pay | Admitting: General Surgery

## 2017-12-13 DIAGNOSIS — K8020 Calculus of gallbladder without cholecystitis without obstruction: Secondary | ICD-10-CM

## 2017-12-13 NOTE — Telephone Encounter (Signed)
   [facility] Pre-operative Risk Assessment    Request for surgical clearance:  1. What type of surgery is being performed?  Laparoscopic cholecystectomy   2. When is this surgery scheduled? TBD   3. What type of clearance is required (medical clearance vs. Pharmacy clearance to hold med vs. Both)? BOTH  4. Are there any medications that need to be held prior to surgery and how long? Aspirin   5. Practice name and name of physician performing surgery? [facility], Dr. Dalbert Batman   6. What is your office phone number [phone]    7.   What is your office fax number  [phone] Attn: Marguarite Arbour, RMA  8.   Anesthesia type (None, local, MAC, general) ? General Anesthesia   Logan Reynolds 12/13/2017, 3:13 PM  _________________________________________________________________

## 2017-12-14 NOTE — Telephone Encounter (Signed)
Dr. Elease HashimotoNahser, This pt is for lap chole. He has hx of CABG 12/2014. Can asa be held for procedure?  Please route response back to P CV DIV PREOP  Thanks, NIna

## 2017-12-17 NOTE — Telephone Encounter (Signed)
Ok to hold ASA for lap chole He is at low risk

## 2017-12-18 ENCOUNTER — Encounter (HOSPITAL_COMMUNITY): Payer: BLUE CROSS/BLUE SHIELD

## 2017-12-19 NOTE — Telephone Encounter (Signed)
I called patient to discuss his current status for cardiac clearance, no answer.  Voicemail left on cell phone.  I called his home phone and spoke with his wife who will try to have him call me back today.  The patient is currently at work.

## 2017-12-19 NOTE — Telephone Encounter (Signed)
   Primary Cardiologist: Kristeen MissPhilip Nahser, MD  Chart reviewed as part of pre-operative protocol coverage. Patient was contacted 12/19/2017 in reference to pre-operative risk assessment for pending surgery as outlined below.  Logan Mcgeeavid Ray Reynolds was last seen on 07/27/17 by Dr. Elease HashimotoNahser.  Since that day, Logan Reynolds has done well with no chest discomfort or shortness of breath. He works full time as an Personnel officerelectrician and is able to complete more than 4 Mets of activity without symptoms.  Therefore, based on ACC/AHA guidelines, the patient would be at acceptable risk for the planned procedure without further cardiovascular testing.   Aspirin can be held as needed for surgery.   I will route this recommendation to the requesting party via Epic fax function and remove from pre-op pool.  Please call with questions.  Berton BonJanine Melessia Kaus, NP 12/19/2017, 2:02 PM

## 2017-12-20 ENCOUNTER — Ambulatory Visit (HOSPITAL_COMMUNITY)
Admission: RE | Admit: 2017-12-20 | Discharge: 2017-12-20 | Disposition: A | Discharge status: Home / Self Care | Payer: BLUE CROSS/BLUE SHIELD | Source: Ambulatory Visit | Attending: General Surgery | Admitting: General Surgery

## 2017-12-20 DIAGNOSIS — K8020 Calculus of gallbladder without cholecystitis without obstruction: Secondary | ICD-10-CM | POA: Diagnosis not present

## 2017-12-20 MED ORDER — TECHNETIUM TC 99M MEBROFENIN IV KIT
5.0000 | PACK | Freq: Once | INTRAVENOUS | Status: AC | PRN
  Administered 2017-12-20: 5 via INTRAVENOUS

## 2017-12-24 ENCOUNTER — Other Ambulatory Visit: Payer: Self-pay | Admitting: Cardiovascular Disease

## 2017-12-27 NOTE — Pre-Procedure Instructions (Addendum)
Logan Reynolds  12/27/2017      [facility] - [place] - [address] [place] [id number] Phone: [phone] Fax: [phone]    Your procedure is scheduled on December 31, 2017.  [facility] at 530 AM.  Call this number if you have problems the morning of surgery:  [phone]   Remember:  Do not eat after midnight.  You may drink clear liquids until 0430 .  Clear liquids allowed are:                    Water, Juice (non-citric and without pulp), Clear Tea, Black Coffee only and Gatorade - DO NOT ADD MILK OR SUGAR    Take these medicines the morning of surgery with A SIP OF WATER  Tylenol-if needed Metoprolol tartrate (lopressor) Omeprazole (prilosec) Alprazolam (xanax)-if needed  Follow your surgeon's instructions on when to hold/resume aspirin.  If no instructions were given call the office to determine how they would like to you take aspirin  7 days prior to surgery STOP taking any meloxicam (mobic),  Aleve, Naproxen, Ibuprofen, Motrin, Advil, Goody's, BC's, all herbal medications, fish oil, and all vitamins  WHAT DO I DO ABOUT MY DIABETES MEDICATION?   Marland Kitchen. Do not take oral diabetes medicines (pills) the morning of surgery-metformin (glucophage) or jardiance.  . The day of surgery, do not take other diabetes injectables, including Byetta (exenatide), Bydureon (exenatide ER), Victoza (liraglutide), or Trulicity (dulaglutide), Ozempic.  Reviewed and Endorsed by [facility] Patient Education Committee, August 2015  How to Manage Your Diabetes Before and After Surgery  Why is it important to control my blood sugar before and after surgery? . Improving blood sugar levels before and after surgery helps healing and can limit problems. . A way of improving blood sugar control is eating a healthy diet by: o  Eating less sugar and carbohydrates o  Increasing activity/exercise o  Talking with your  doctor about reaching your blood sugar goals . High blood sugars (greater than 180 mg/dL) can raise your risk of infections and slow your recovery, so you will need to focus on controlling your diabetes during the weeks before surgery. . Make sure that the doctor who takes care of your diabetes knows about your planned surgery including the date and location.  How do I manage my blood sugar before surgery? . Check your blood sugar at least 4 times a day, starting 2 days before surgery, to make sure that the level is not too high or low. o Check your blood sugar the morning of your surgery when you wake up and every 2 hours until you get to the Short Stay unit. . If your blood sugar is less than 70 mg/dL, you will need to treat for low blood sugar: o Do not take insulin. o Treat a low blood sugar (less than 70 mg/dL) with  cup of clear juice (cranberry or apple), 4 glucose tablets, OR glucose gel. Recheck blood sugar in 15 minutes after treatment (to make sure it is greater than 70 mg/dL). If your blood sugar is not greater than 70 mg/dL on recheck, call [phone] o  for further instructions. . Report your blood sugar to the short stay nurse when you get to Short Stay.  . If you are admitted to the hospital after surgery: o Your blood sugar will be checked by the staff and you will  probably be given insulin after surgery (instead of oral diabetes medicines) to make sure you have good blood sugar levels. o The goal for blood sugar control after surgery is 80-180 mg/dL    [facility]- Preparing For Surgery  Before surgery, you can play an important role. Because skin is not sterile, your skin needs to be as free of germs as possible. You can reduce the number of germs on your skin by washing with CHG (chlorahexidine gluconate) Soap before surgery.  CHG is an antiseptic cleaner which kills germs and bonds with the skin to continue killing germs even after washing.    Oral Hygiene is also  important to reduce your risk of infection.  Remember - BRUSH YOUR TEETH THE MORNING OF SURGERY WITH YOUR REGULAR TOOTHPASTE  Please do not use if you have an allergy to CHG or antibacterial soaps. If your skin becomes reddened/irritated stop using the CHG.  Do not shave (including legs and underarms) for at least 48 hours prior to first CHG shower. It is OK to shave your face.  Please follow these instructions carefully.   1. Shower the NIGHT BEFORE SURGERY and the MORNING OF SURGERY with CHG.   2. If you chose to wash your hair, wash your hair first as usual with your normal shampoo.  3. After you shampoo, rinse your hair and body thoroughly to remove the shampoo.  4. Use CHG as you would any other liquid soap. You can apply CHG directly to the skin and wash gently with a scrungie or a clean washcloth.   5. Apply the CHG Soap to your body ONLY FROM THE NECK DOWN.  Do not use on open wounds or open sores. Avoid contact with your eyes, ears, mouth and genitals (private parts). Wash Face and genitals (private parts)  with your normal soap.  6. Wash thoroughly, paying special attention to the area where your surgery will be performed.  7. Thoroughly rinse your body with warm water from the neck down.  8. DO NOT shower/wash with your normal soap after using and rinsing off the CHG Soap.  9. Pat yourself dry with a CLEAN TOWEL.  10. Wear CLEAN PAJAMAS to bed the night before surgery, wear comfortable clothes the morning of surgery  11. Place CLEAN SHEETS on your bed the night of your first shower and DO NOT SLEEP WITH PETS.  Day of Surgery:  Do not apply any deodorants/lotions.  Please wear clean clothes to the hospital/surgery center.   Remember to brush your teeth WITH YOUR REGULAR TOOTHPASTE.    Do not wear jewelry  Do not wear lotions, powders, or colognes, or deodorant.  Men may shave face and neck.  Do not bring valuables to the hospital.  [facility] is not responsible for  any belongings or valuables.  Contacts, dentures or bridgework may not be worn into surgery.  Leave your suitcase in the car.  After surgery it may be brought to your room.  For patients admitted to the hospital, discharge time will be determined by your treatment team.  Patients discharged the day of surgery will not be allowed to drive home.   Please read over the following fact sheets that you were given.

## 2017-12-28 ENCOUNTER — Encounter (HOSPITAL_COMMUNITY): Payer: Self-pay

## 2017-12-28 ENCOUNTER — Encounter (HOSPITAL_COMMUNITY)
Admission: RE | Admit: 2017-12-28 | Discharge: 2017-12-28 | Disposition: A | Discharge status: Home / Self Care | Payer: BLUE CROSS/BLUE SHIELD | Source: Ambulatory Visit | Attending: General Surgery | Admitting: General Surgery

## 2017-12-28 ENCOUNTER — Other Ambulatory Visit: Payer: Self-pay

## 2017-12-28 ENCOUNTER — Encounter (HOSPITAL_COMMUNITY): Payer: BLUE CROSS/BLUE SHIELD

## 2017-12-28 DIAGNOSIS — Z01818 Encounter for other preprocedural examination: Secondary | ICD-10-CM | POA: Diagnosis present

## 2017-12-28 DIAGNOSIS — K828 Other specified diseases of gallbladder: Secondary | ICD-10-CM | POA: Insufficient documentation

## 2017-12-28 HISTORY — DX: Acute myocardial infarction, unspecified: I21.9

## 2017-12-28 LAB — CBC WITH DIFFERENTIAL/PLATELET
Abs Immature Granulocytes: 0.02 10*3/uL (ref 0.00–0.07)
Basophils Absolute: 0 10*3/uL (ref 0.0–0.1)
Basophils Relative: 1 %
Eosinophils Absolute: 0.1 10*3/uL (ref 0.0–0.5)
Eosinophils Relative: 2 %
HCT: 46.7 % (ref 39.0–52.0)
Hemoglobin: 14.4 g/dL (ref 13.0–17.0)
Immature Granulocytes: 0 %
LYMPHS ABS: 2.4 10*3/uL (ref 0.7–4.0)
Lymphocytes Relative: 39 %
MCH: 28 pg (ref 26.0–34.0)
MCHC: 30.8 g/dL (ref 30.0–36.0)
MCV: 90.9 fL (ref 80.0–100.0)
Monocytes Absolute: 0.6 10*3/uL (ref 0.1–1.0)
Monocytes Relative: 9 %
Neutro Abs: 3 10*3/uL (ref 1.7–7.7)
Neutrophils Relative %: 49 %
Platelets: 310 10*3/uL (ref 150–400)
RBC: 5.14 MIL/uL (ref 4.22–5.81)
RDW: 13.2 % (ref 11.5–15.5)
WBC: 6.2 10*3/uL (ref 4.0–10.5)
nRBC: 0 % (ref 0.0–0.2)

## 2017-12-28 LAB — LIPASE, BLOOD: Lipase: 66 U/L — ABNORMAL HIGH (ref 11–51)

## 2017-12-28 LAB — COMPREHENSIVE METABOLIC PANEL
ALT: 37 U/L (ref 0–44)
AST: 30 U/L (ref 15–41)
Albumin: 4.2 g/dL (ref 3.5–5.0)
Alkaline Phosphatase: 53 U/L (ref 38–126)
Anion gap: 12 (ref 5–15)
BUN: 17 mg/dL (ref 6–20)
CO2: 25 mmol/L (ref 22–32)
Calcium: 9.5 mg/dL (ref 8.9–10.3)
Chloride: 102 mmol/L (ref 98–111)
Creatinine, Ser: 1.15 mg/dL (ref 0.61–1.24)
GFR calc Af Amer: >60 mL/min (ref >60)
GFR calc non Af Amer: >60 mL/min (ref >60)
Glucose, Bld: 137 mg/dL — ABNORMAL HIGH (ref 70–99)
Potassium: 4.1 mmol/L (ref 3.5–5.1)
Sodium: 139 mmol/L (ref 135–145)
Total Bilirubin: 0.9 mg/dL (ref 0.3–1.2)
Total Protein: 7.5 g/dL (ref 6.5–8.1)

## 2017-12-28 LAB — HEMOGLOBIN A1C
Hgb A1c MFr Bld: 6.9 % — ABNORMAL HIGH (ref 4.8–5.6)
Mean Plasma Glucose: 151.33 mg/dL

## 2017-12-28 LAB — GLUCOSE, CAPILLARY: Glucose-Capillary: 130 mg/dL — ABNORMAL HIGH (ref 70–99)

## 2017-12-28 NOTE — Progress Notes (Addendum)
PCP - Kirstie PeriAshish Shah, MD Cardiologist - Kristeen MissPhilip Nahser, MD  Chest x-ray - denies past year, no recent respiratory infections/complications EKG -  07/27/17 in EPIC  Stress Test -denies past 5-10 year ECHO - denies past 5-10 years  Cardiac Cath -  12/2014  Sleep Study - n/a  CPAP - n/a  Fasting Blood Sugar - 140s Checks Blood Sugar _____ times a day- does not check it regularly at home  Blood Thinner Instructions: Instructed to hold ASA beginning 12/24/17 by Dr. Derrell LollingINgram Aspirin Instructions: ASA 81 mg-  Anesthesia review: yes  Patient denies shortness of breath, fever, cough and chest pain at PAT appointment  Patient verbalized understanding of instructions that were given to them at the PAT appointment. Patient was also instructed that they will need to review over the PAT instructions again at home before surgery.

## 2017-12-28 NOTE — H&P (Addendum)
Logan Reynolds Location: [facility] Patient #: [id number] DOB: August 02, 1965 Married / Language: English / Race: White Male      History of Present Illness     This is a very pleasant 52 year old man from [place]. His wife is with him throughout the encounter. He is referred by [facility] for consideration of cholecystectomy prior to aortobifemoral bypass.      On Thanksgiving day after dinner he had a 2 hour episode of right upper quadrant pain but no nausea or vomiting. This subsided after 2 or 3 hours. On December 2 he had an episode of right upper quadrant pain very localized lasted about 18 hours. He went to the [facility] emergency department. CT scan showed chronic aortoiliac occlusion. Hazy appearance around the gallbladder which was prominent in size and they questioned acute cholecystitis. Tiny lung base nodules. Ultrasound shows a hydropic gallbladder containing sludge with borderline wall thickening and positive sonographic Murphy sign. Acute cholecystitis was thought to be possible. Fatty liver noted. Urinalysis was clear. Hemoglobin 15.2. WBC 9300. Liver function tests normal. Creatinine 1.01. Glucose 153. Lipase 55 borderline elevated. He went home from the ER and continued to have some mild postprandial discomfort for a couple of days but that has subsided. He saw Dr. Chestine Spore [facility]. He was advised to have an aortobifemoral bypass graft. Dr. Chestine Spore advised him to have his gallbladder surgery first to avoid graft infection, which is of course appropriate He is comfortable in the office today       Comorbidities include former tobacco use. Quit in 2016. NSTEMI and CABG 2016 Dr. Laneta Simmers Dr. Elease Hashimoto. GERD. Non-insulin-dependent diabetes. Hypertension. Hyperlipidemia. Social history lives in [place] and is married. Wife with him today. 3 children. Works as an Personnel officer. Quit smoking 3 years ago.     I strongly  suspect that his right upper quadrant pain is due to biliary tract disease. I agree that he needs cholecystectomy as a prelude to aortobifemoral bypass graft He will be scheduled for laparoscopic cholecystectomy with cholangiogram. I discussed the indications, details, techniques, and risk of the surgery with him and his wife. He is aware the risks of bleeding, infection, conversion to open laparotomy, port site hernia, pancreatitis, bile leak, injury to adjacent organs requiring major reconstructive surgery. He understands all these issues. All of his questions are answered. He agrees with this plan     He will require cardiac risk assessment and clearance by Dr. Elease Hashimoto We will get a biliary scan with ejection fraction I asked him to stop his aspirin 3-5 days preop Plan laparoscopic cholecystectomy at [facility] due to cardiac history   Diagnostic Studies History  Colonoscopy  never  Allergies  No Known Drug Allergies  Allergies Reconciled   Medication History Ozempic (0.25 or 0.5MG /DOSE Soln Pen-inj, Subcutaneous) Active. Rosuvastatin Calcium (40MG  Tablet, Oral) Active. Ondansetron (4MG  Tablet Disint, Oral) Active. Omeprazole (40MG  Capsule DR, Oral) Active. Metoprolol Tartrate (25MG  Tablet, Oral) Active. metFORMIN HCl (500MG  Tablet, Oral) Active. Meloxicam (15MG  Tablet, Oral) Active. Lisinopril (2.5MG  Tablet, Oral) Active. Jardiance (25MG  Tablet, Oral) Active. Aspirin Low Dose (81MG  Tablet DR, Oral) Active. ALPRAZolam (0.5MG  Tablet, Oral) Active. oxyCODONE-Acetaminophen (5-325MG  Tablet, Oral) Active. HYDROcodone-Homatropine (5-1.5MG /5ML Syrup, Oral) Active. Medications Reconciled  Social History Alcohol use  Remotely quit alcohol use. Caffeine use  Coffee. No drug use  Tobacco use  Former smoker.  Family History Cervical Cancer  Mother.  Back Pain  Diabetes Mellitus  Gastric Ulcer  Gastroesophageal Reflux  Disease     Review of  Systems General Not Present- Appetite Loss, Chills, Fatigue, Fever, Night Sweats, Weight Gain and Weight Loss. Skin Not Present- Change in Wart/Mole, Dryness, Hives, Jaundice, New Lesions, Non-Healing Wounds, Rash and Ulcer. HEENT Not Present- Earache, Hearing Loss, Hoarseness, Nose Bleed, Oral Ulcers, Ringing in the Ears, Seasonal Allergies, Sinus Pain, Sore Throat, Visual Disturbances, Wears glasses/contact lenses and Yellow Eyes. Respiratory Not Present- Bloody sputum, Chronic Cough, Difficulty Breathing, Snoring and Wheezing. Cardiovascular Not Present- Chest Pain, Difficulty Breathing Lying Down, Leg Cramps, Palpitations, Rapid Heart Rate, Shortness of Breath and Swelling of Extremities. Gastrointestinal Present- Abdominal Pain. Not Present- Bloating, Bloody Stool, Change in Bowel Habits, Chronic diarrhea, Constipation, Difficulty Swallowing, Excessive gas, Gets full quickly at meals, Hemorrhoids, Indigestion, Nausea, Rectal Pain and Vomiting. Male Genitourinary Not Present- Blood in Urine, Change in Urinary Stream, Frequency, Impotence, Nocturia, Painful Urination, Urgency and Urine Leakage. Musculoskeletal Not Present- Back Pain, Joint Pain, Joint Stiffness, Muscle Pain, Muscle Weakness and Swelling of Extremities. Neurological Not Present- Decreased Memory, Fainting, Headaches, Numbness, Seizures, Tingling, Tremor, Trouble walking and Weakness. Psychiatric Not Present- Anxiety, Bipolar, Change in Sleep Pattern, Depression, Fearful and Frequent crying. Endocrine Not Present- Cold Intolerance, Excessive Hunger, Hair Changes, Heat Intolerance, Hot flashes and New Diabetes. Hematology Not Present- Blood Thinners, Easy Bruising, Excessive bleeding, Gland problems, HIV and Persistent Infections.  Vitals Weight: 199.38 lb Height: 71in Body Surface Area: 2.11 m Body Mass Index: 27.81 kg/m  Temp.: 97.40F  Pulse: 99 (Regular)  P.OX: 98% (Room air) BP: 110/68 (Sitting, Left Arm,  Standard)    Physical Exam General Mental Status-Alert. General Appearance-Consistent with stated age. Hydration-Well hydrated. Voice-Normal.  Head and Neck Head-normocephalic, atraumatic with no lesions or palpable masses. Trachea-midline. Thyroid Gland Characteristics - normal size and consistency.  Eye Eyeball - Bilateral-Extraocular movements intact. Sclera/Conjunctiva - Bilateral-No scleral icterus.  Chest and Lung Exam Chest and lung exam reveals -quiet, even and easy respiratory effort with no use of accessory muscles and on auscultation, normal breath sounds, no adventitious sounds and normal vocal resonance. Inspection Chest Wall - Normal. Back - normal.  Cardiovascular Note: Median sternotomy incision well-healed. Regular rate and rhythm. 2+ palpable radial pulses. No palpable femoral pulses. No tissue loss or motor or sensory loss to either lower extremity.   Abdomen Inspection Inspection of the abdomen reveals - No Hernias. Skin - Scar - no surgical scars. Palpation/Percussion Palpation and Percussion of the abdomen reveal - Soft, Non Tender, No Rebound tenderness, No Rigidity (guarding) and No hepatosplenomegaly. Auscultation Auscultation of the abdomen reveals - Bowel sounds normal. Note: Abdominal exam basically benign. Perhaps a little bit uncomfortable in the right upper quadrant but this is subjective. No mass   Neurologic Neurologic evaluation reveals -alert and oriented x 3 with no impairment of recent or remote memory. Mental Status-Normal.  Musculoskeletal Normal Exam - Left-Upper Extremity Strength Normal and Lower Extremity Strength Normal. Normal Exam - Right-Upper Extremity Strength Normal and Lower Extremity Strength Normal.  Lymphatic Head & Neck  General Head & Neck Lymphatics: Bilateral - Description - Normal. Axillary  General Axillary Region: Bilateral - Description - Normal. Tenderness - Non  Tender. Femoral & Inguinal  Generalized Femoral & Inguinal Lymphatics: Bilateral - Description - Normal. Tenderness - Non Tender.    Assessment & Plan GALLSTONES (K80.20)    Since Thanksgiving you have had 2 or 3 episodes of right upper quadrant pain A severe attack led to evaluation [facility] department on December 2 They found  that you had distal aortoiliac occlusion which is chronic but is the cause of your leg cramps They also found that your gallbladder looked a little bit hazy thick-walled and contained sludge. When they examined you were apparently very tender in the right upper abdomen Fortunately, the lab work was normal. Fortunately you are better today I suspect you are having gallbladder attacks  Dr. Chestine Sporelark has evaluated you and has recommended iliac aortobifemoral bypass to restore circulation to your legs He has recommended that we perform the gallbladder surgery first to avoid infection of the graft and that is appropriate  you will be scheduled for laparoscopic cholecystectomy with cholangiogram I discussed the indications, techniques, and risk of this surgery in detail with you and your wife You'll be scheduled for hepatobiliary scan You'll be referred to Dr. 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 Derrell LollingIngram, M.D., [facility] General and Minimally invasive Surgery Breast and Colorectal Surgery Office:   [phone] Pager:   [phone]

## 2017-12-30 ENCOUNTER — Encounter (HOSPITAL_COMMUNITY): Payer: Self-pay | Admitting: Anesthesiology

## 2017-12-30 NOTE — Anesthesia Preprocedure Evaluation (Addendum)
Anesthesia Evaluation  Patient identified by MRN, date of birth, ID band Patient awake    Reviewed: Allergy & Precautions, NPO status , Patient's Chart, lab work & pertinent test results, reviewed documented beta blocker date and time   Airway Mallampati: II  TM Distance: >3 FB Neck ROM: Full    Dental no notable dental hx. (+) Teeth Intact   Pulmonary former smoker,    Pulmonary exam normal breath sounds clear to auscultation       Cardiovascular hypertension, Pt. on medications and Pt. on home beta blockers + angina with exertion + CAD, + Past MI and + CABG  Normal cardiovascular exam Rhythm:Regular Rate:Normal  Hx/o Non STEMI 2016 CABG 4 v 2017 Aortoiliac occlusive disease   Neuro/Psych Anxiety negative neurological ROS     GI/Hepatic Neg liver ROS, GERD  Medicated,Symptomatic cholelithiasis   Endo/Other  diabetes, Well Controlled, Type 2, Oral Hypoglycemic Agentshyperlipidemia  Renal/GU negative Renal ROS  negative genitourinary   Musculoskeletal negative musculoskeletal ROS (+)   Abdominal   Peds  Hematology negative hematology ROS (+)   Anesthesia Other Findings   Reproductive/Obstetrics                           Anesthesia Physical Anesthesia Plan  ASA: III  Anesthesia Plan: General   Post-op Pain Management:    Induction: Intravenous, Cricoid pressure planned and Rapid sequence  PONV Risk Score and Plan: 3 and Ondansetron, Treatment may vary due to age or medical condition, Propofol infusion, Midazolam and Dexamethasone  Airway Management Planned: Oral ETT  Additional Equipment:   Intra-op Plan:   Post-operative Plan: Extubation in OR  Informed Consent: I have reviewed the patients History and Physical, chart, labs and discussed the procedure including the risks, benefits and alternatives for the proposed anesthesia with the patient or authorized representative who has  indicated his/her understanding and acceptance.   Dental advisory given  Plan Discussed with: CRNA and Surgeon  Anesthesia Plan Comments:         Anesthesia Quick Evaluation

## 2017-12-31 ENCOUNTER — Encounter (HOSPITAL_COMMUNITY)
Admission: RE | Disposition: A | Discharge status: Home / Self Care | Payer: Self-pay | Source: Home / Self Care | Attending: General Surgery

## 2017-12-31 ENCOUNTER — Other Ambulatory Visit: Payer: Self-pay

## 2017-12-31 ENCOUNTER — Ambulatory Visit (HOSPITAL_COMMUNITY): Payer: BLUE CROSS/BLUE SHIELD

## 2017-12-31 ENCOUNTER — Ambulatory Visit (HOSPITAL_COMMUNITY)
Admission: RE | Admit: 2017-12-31 | Discharge: 2017-12-31 | Disposition: A | Discharge status: Home / Self Care | Payer: BLUE CROSS/BLUE SHIELD | Attending: General Surgery | Admitting: General Surgery

## 2017-12-31 ENCOUNTER — Encounter (HOSPITAL_COMMUNITY): Payer: Self-pay

## 2017-12-31 ENCOUNTER — Ambulatory Visit (HOSPITAL_COMMUNITY): Payer: BLUE CROSS/BLUE SHIELD | Admitting: Anesthesiology

## 2017-12-31 DIAGNOSIS — Z79899 Other long term (current) drug therapy: Secondary | ICD-10-CM | POA: Insufficient documentation

## 2017-12-31 DIAGNOSIS — Z419 Encounter for procedure for purposes other than remedying health state, unspecified: Secondary | ICD-10-CM

## 2017-12-31 DIAGNOSIS — K219 Gastro-esophageal reflux disease without esophagitis: Secondary | ICD-10-CM | POA: Diagnosis not present

## 2017-12-31 DIAGNOSIS — Z8049 Family history of malignant neoplasm of other genital organs: Secondary | ICD-10-CM | POA: Insufficient documentation

## 2017-12-31 DIAGNOSIS — E785 Hyperlipidemia, unspecified: Secondary | ICD-10-CM | POA: Insufficient documentation

## 2017-12-31 DIAGNOSIS — F419 Anxiety disorder, unspecified: Secondary | ICD-10-CM | POA: Diagnosis not present

## 2017-12-31 DIAGNOSIS — I7409 Other arterial embolism and thrombosis of abdominal aorta: Secondary | ICD-10-CM | POA: Insufficient documentation

## 2017-12-31 DIAGNOSIS — K828 Other specified diseases of gallbladder: Secondary | ICD-10-CM | POA: Diagnosis not present

## 2017-12-31 DIAGNOSIS — K811 Chronic cholecystitis: Secondary | ICD-10-CM | POA: Diagnosis not present

## 2017-12-31 DIAGNOSIS — I25119 Atherosclerotic heart disease of native coronary artery with unspecified angina pectoris: Secondary | ICD-10-CM | POA: Insufficient documentation

## 2017-12-31 DIAGNOSIS — K8012 Calculus of gallbladder with acute and chronic cholecystitis without obstruction: Secondary | ICD-10-CM | POA: Diagnosis present

## 2017-12-31 DIAGNOSIS — Z791 Long term (current) use of non-steroidal anti-inflammatories (NSAID): Secondary | ICD-10-CM | POA: Diagnosis not present

## 2017-12-31 DIAGNOSIS — Z7984 Long term (current) use of oral hypoglycemic drugs: Secondary | ICD-10-CM | POA: Diagnosis not present

## 2017-12-31 DIAGNOSIS — Z87891 Personal history of nicotine dependence: Secondary | ICD-10-CM | POA: Insufficient documentation

## 2017-12-31 DIAGNOSIS — Z951 Presence of aortocoronary bypass graft: Secondary | ICD-10-CM | POA: Diagnosis not present

## 2017-12-31 DIAGNOSIS — K259 Gastric ulcer, unspecified as acute or chronic, without hemorrhage or perforation: Secondary | ICD-10-CM | POA: Diagnosis not present

## 2017-12-31 DIAGNOSIS — E119 Type 2 diabetes mellitus without complications: Secondary | ICD-10-CM | POA: Diagnosis not present

## 2017-12-31 DIAGNOSIS — I252 Old myocardial infarction: Secondary | ICD-10-CM | POA: Insufficient documentation

## 2017-12-31 HISTORY — PX: CHOLECYSTECTOMY: SHX55

## 2017-12-31 HISTORY — DX: Chronic cholecystitis: K81.1

## 2017-12-31 LAB — GLUCOSE, CAPILLARY
Glucose-Capillary: 139 mg/dL — ABNORMAL HIGH (ref 70–99)
Glucose-Capillary: 164 mg/dL — ABNORMAL HIGH (ref 70–99)

## 2017-12-31 SURGERY — LAPAROSCOPIC CHOLECYSTECTOMY WITH INTRAOPERATIVE CHOLANGIOGRAM
Anesthesia: General | Site: Abdomen

## 2017-12-31 MED ORDER — PHENYLEPHRINE HCL 10 MG/ML IJ SOLN
INTRAMUSCULAR | Status: DC | PRN
  Administered 2017-12-31 (×2): 120 ug via INTRAVENOUS
  Administered 2017-12-31: 80 ug via INTRAVENOUS

## 2017-12-31 MED ORDER — BUPIVACAINE-EPINEPHRINE 0.5% -1:200000 IJ SOLN
INTRAMUSCULAR | Status: DC | PRN
  Administered 2017-12-31: 19 mL

## 2017-12-31 MED ORDER — ACETAMINOPHEN 500 MG PO TABS
1000.0000 mg | ORAL_TABLET | ORAL | Status: AC
  Administered 2017-12-31: 1000 mg via ORAL
  Filled 2017-12-31: qty 2

## 2017-12-31 MED ORDER — BUPIVACAINE-EPINEPHRINE (PF) 0.25% -1:200000 IJ SOLN
INTRAMUSCULAR | Status: AC
  Filled 2017-12-31: qty 30

## 2017-12-31 MED ORDER — PHENYLEPHRINE 40 MCG/ML (10ML) SYRINGE FOR IV PUSH (FOR BLOOD PRESSURE SUPPORT)
PREFILLED_SYRINGE | INTRAVENOUS | Status: AC
  Filled 2017-12-31: qty 10

## 2017-12-31 MED ORDER — ONDANSETRON HCL 4 MG/2ML IJ SOLN
INTRAMUSCULAR | Status: DC | PRN
  Administered 2017-12-31: 4 mg via INTRAVENOUS

## 2017-12-31 MED ORDER — HYDROMORPHONE HCL 1 MG/ML IJ SOLN
INTRAMUSCULAR | Status: AC
  Filled 2017-12-31: qty 1

## 2017-12-31 MED ORDER — FENTANYL CITRATE (PF) 100 MCG/2ML IJ SOLN
INTRAMUSCULAR | Status: DC | PRN
  Administered 2017-12-31: 100 ug via INTRAVENOUS
  Administered 2017-12-31: 50 ug via INTRAVENOUS

## 2017-12-31 MED ORDER — ONDANSETRON HCL 4 MG/2ML IJ SOLN
INTRAMUSCULAR | Status: AC
  Filled 2017-12-31: qty 2

## 2017-12-31 MED ORDER — MIDAZOLAM HCL 2 MG/2ML IJ SOLN
INTRAMUSCULAR | Status: AC
  Filled 2017-12-31: qty 2

## 2017-12-31 MED ORDER — MEPERIDINE HCL 50 MG/ML IJ SOLN: 6.2500 mg | INTRAMUSCULAR | Status: DC | PRN

## 2017-12-31 MED ORDER — DEXAMETHASONE SODIUM PHOSPHATE 10 MG/ML IJ SOLN
INTRAMUSCULAR | Status: AC
  Filled 2017-12-31: qty 1

## 2017-12-31 MED ORDER — SUGAMMADEX SODIUM 200 MG/2ML IV SOLN
INTRAVENOUS | Status: DC | PRN
  Administered 2017-12-31: 200 mg via INTRAVENOUS

## 2017-12-31 MED ORDER — LACTATED RINGERS IV SOLN
INTRAVENOUS | Status: DC | PRN
  Administered 2017-12-31 (×2): via INTRAVENOUS

## 2017-12-31 MED ORDER — PROPOFOL 10 MG/ML IV BOLUS
INTRAVENOUS | Status: DC | PRN
  Administered 2017-12-31: 180 mg via INTRAVENOUS

## 2017-12-31 MED ORDER — BUPIVACAINE-EPINEPHRINE (PF) 0.5% -1:200000 IJ SOLN
INTRAMUSCULAR | Status: AC
  Filled 2017-12-31: qty 1.8

## 2017-12-31 MED ORDER — CEFAZOLIN SODIUM-DEXTROSE 2-4 GM/100ML-% IV SOLN
2.0000 g | INTRAVENOUS | Status: AC
  Administered 2017-12-31: 2 g via INTRAVENOUS
  Filled 2017-12-31: qty 100

## 2017-12-31 MED ORDER — ROCURONIUM BROMIDE 50 MG/5ML IV SOSY
PREFILLED_SYRINGE | INTRAVENOUS | Status: DC | PRN
  Administered 2017-12-31: 10 mg via INTRAVENOUS
  Administered 2017-12-31: 50 mg via INTRAVENOUS

## 2017-12-31 MED ORDER — ONDANSETRON HCL 4 MG/2ML IJ SOLN: 4.0000 mg | Freq: Once | INTRAMUSCULAR | Status: DC | PRN

## 2017-12-31 MED ORDER — IOPAMIDOL (ISOVUE-300) INJECTION 61%
INTRAVENOUS | Status: AC
  Filled 2017-12-31: qty 50

## 2017-12-31 MED ORDER — FENTANYL CITRATE (PF) 250 MCG/5ML IJ SOLN
INTRAMUSCULAR | Status: AC
  Filled 2017-12-31: qty 5

## 2017-12-31 MED ORDER — HYDROMORPHONE HCL 1 MG/ML IJ SOLN
0.2500 mg | INTRAMUSCULAR | Status: DC | PRN
  Administered 2017-12-31 (×2): 0.5 mg via INTRAVENOUS

## 2017-12-31 MED ORDER — SODIUM CHLORIDE 0.9 % IV SOLN
INTRAVENOUS | Status: DC | PRN
  Administered 2017-12-31: 100 mL

## 2017-12-31 MED ORDER — PROPOFOL 10 MG/ML IV BOLUS
INTRAVENOUS | Status: AC
  Filled 2017-12-31: qty 20

## 2017-12-31 MED ORDER — MIDAZOLAM HCL 5 MG/5ML IJ SOLN
INTRAMUSCULAR | Status: DC | PRN
  Administered 2017-12-31: 2 mg via INTRAVENOUS

## 2017-12-31 MED ORDER — SODIUM CHLORIDE 0.9 % IV SOLN
INTRAVENOUS | Status: DC | PRN
  Administered 2017-12-31: 25 ug/min via INTRAVENOUS

## 2017-12-31 MED ORDER — GABAPENTIN 300 MG PO CAPS
300.0000 mg | ORAL_CAPSULE | ORAL | Status: AC
  Administered 2017-12-31: 300 mg via ORAL
  Filled 2017-12-31: qty 1

## 2017-12-31 MED ORDER — CHLORHEXIDINE GLUCONATE CLOTH 2 % EX PADS: 6.0000 | MEDICATED_PAD | Freq: Once | CUTANEOUS | Status: DC

## 2017-12-31 MED ORDER — LIDOCAINE 2% (20 MG/ML) 5 ML SYRINGE
INTRAMUSCULAR | Status: DC | PRN
  Administered 2017-12-31: 80 mg via INTRAVENOUS

## 2017-12-31 MED ORDER — DEXAMETHASONE SODIUM PHOSPHATE 10 MG/ML IJ SOLN
INTRAMUSCULAR | Status: DC | PRN
  Administered 2017-12-31: 4 mg via INTRAVENOUS

## 2017-12-31 MED ORDER — HYDROCODONE-ACETAMINOPHEN 5-325 MG PO TABS: 1.0000 | ORAL_TABLET | ORAL | Status: DC | PRN

## 2017-12-31 MED ORDER — CELECOXIB 200 MG PO CAPS
200.0000 mg | ORAL_CAPSULE | ORAL | Status: AC
  Administered 2017-12-31: 200 mg via ORAL
  Filled 2017-12-31: qty 1

## 2017-12-31 SURGICAL SUPPLY — 40 items
APPLIER CLIP ROT 10 11.4 M/L (STAPLE) ×2
BLADE CLIPPER SURG (BLADE) IMPLANT
CANISTER SUCT 3000ML PPV (MISCELLANEOUS) ×2 IMPLANT
CHLORAPREP W/TINT 26ML (MISCELLANEOUS) ×2 IMPLANT
CLIP APPLIE ROT 10 11.4 M/L (STAPLE) ×1 IMPLANT
COVER MAYO STAND STRL (DRAPES) ×2 IMPLANT
COVER SURGICAL LIGHT HANDLE (MISCELLANEOUS) ×2 IMPLANT
COVER WAND RF STERILE (DRAPES) ×2 IMPLANT
DERMABOND ADVANCED (GAUZE/BANDAGES/DRESSINGS) ×1
DERMABOND ADVANCED .7 DNX12 (GAUZE/BANDAGES/DRESSINGS) ×1 IMPLANT
DRAPE C-ARM 42X72 X-RAY (DRAPES) ×2 IMPLANT
ELECT REM PT RETURN 9FT ADLT (ELECTROSURGICAL) ×2
ELECTRODE REM PT RTRN 9FT ADLT (ELECTROSURGICAL) ×1 IMPLANT
GLOVE EUDERMIC 7 POWDERFREE (GLOVE) ×2 IMPLANT
GOWN STRL REUS W/ TWL LRG LVL3 (GOWN DISPOSABLE) ×2 IMPLANT
GOWN STRL REUS W/ TWL XL LVL3 (GOWN DISPOSABLE) ×1 IMPLANT
GOWN STRL REUS W/TWL LRG LVL3 (GOWN DISPOSABLE) ×2
GOWN STRL REUS W/TWL XL LVL3 (GOWN DISPOSABLE) ×1
KIT BASIN OR (CUSTOM PROCEDURE TRAY) ×2 IMPLANT
KIT TURNOVER KIT B (KITS) ×2 IMPLANT
NS IRRIG 1000ML POUR BTL (IV SOLUTION) ×2 IMPLANT
PAD ARMBOARD 7.5X6 YLW CONV (MISCELLANEOUS) ×2 IMPLANT
POUCH RETRIEVAL ECOSAC 10 (ENDOMECHANICALS) IMPLANT
POUCH RETRIEVAL ECOSAC 10MM (ENDOMECHANICALS)
POUCH SPECIMEN RETRIEVAL 10MM (ENDOMECHANICALS) ×2 IMPLANT
SCISSORS LAP 5X35 DISP (ENDOMECHANICALS) ×2 IMPLANT
SET CHOLANGIOGRAPH 5 50 .035 (SET/KITS/TRAYS/PACK) ×2 IMPLANT
SET IRRIG TUBING LAPAROSCOPIC (IRRIGATION / IRRIGATOR) ×2 IMPLANT
SLEEVE ENDOPATH XCEL 5M (ENDOMECHANICALS) ×2 IMPLANT
SPECIMEN JAR SMALL (MISCELLANEOUS) ×2 IMPLANT
SUT MNCRL AB 4-0 PS2 18 (SUTURE) ×2 IMPLANT
SUT VICRYL 0 UR6 27IN ABS (SUTURE) ×2 IMPLANT
TOWEL OR 17X24 6PK STRL BLUE (TOWEL DISPOSABLE) ×2 IMPLANT
TOWEL OR 17X26 10 PK STRL BLUE (TOWEL DISPOSABLE) ×2 IMPLANT
TRAY LAPAROSCOPIC MC (CUSTOM PROCEDURE TRAY) ×2 IMPLANT
TROCAR XCEL BLUNT TIP 100MML (ENDOMECHANICALS) ×2 IMPLANT
TROCAR XCEL NON-BLD 11X100MML (ENDOMECHANICALS) ×2 IMPLANT
TROCAR XCEL NON-BLD 5MMX100MML (ENDOMECHANICALS) ×2 IMPLANT
TUBING INSUFFLATION (TUBING) ×2 IMPLANT
WATER STERILE IRR 1000ML POUR (IV SOLUTION) ×2 IMPLANT

## 2017-12-31 NOTE — Anesthesia Procedure Notes (Signed)
Procedure Name: Intubation Date/Time: 12/31/2017 7:33 AM Performed by: Inda Coke, CRNA Pre-anesthesia Checklist: Patient identified, Emergency Drugs available, Suction available and Patient being monitored Patient Re-evaluated:Patient Re-evaluated prior to induction Oxygen Delivery Method: Circle System Utilized Preoxygenation: Pre-oxygenation with 100% oxygen Induction Type: IV induction Ventilation: Mask ventilation without difficulty Laryngoscope Size: Mac and 4 Grade View: Grade II Tube type: Oral Tube size: 7.5 mm Number of attempts: 1 Airway Equipment and Method: Stylet and Oral airway Placement Confirmation: ETT inserted through vocal cords under direct vision,  positive ETCO2 and breath sounds checked- equal and bilateral Secured at: 22 cm Tube secured with: Tape Dental Injury: Teeth and Oropharynx as per pre-operative assessment

## 2017-12-31 NOTE — Op Note (Signed)
Patient Name:           Logan Reynolds   Date of Surgery:        12/31/2017  Pre op Diagnosis:      Chronic cholecystitis  Post op Diagnosis:    Same  Procedure:                 Laparoscopic cholecystectomy with cholangiogram  Surgeon:                     Angelia MouldHaywood M. Derrell LollingIngram, M.D., FACS  Assistant:                      Magnus IvanSheila Bowman, RNFA  Operative Indications:        This is a very pleasant 52 year old man from [place]. He is referred by Dr. Chestine [facility]orelark [facility] for consideration of cholecystectomy prior to aortobifemoral bypass.      On Thanksgiving day after dinner he had a 2 hour episode of right upper quadrant pain but no nausea or vomiting. This subsided after 2 or 3 hours. On December 2 he had an episode of right upper quadrant pain very localized lasted about 18 hours. [facility]. CT scan showed chronic aortoiliac occlusion. Hazy appearance around the gallbladder which was prominent in size and they questioned acute cholecystitis. Tiny lung base nodules. Ultrasound shows a hydropic gallbladder containing sludge with borderline wall thickening and positive sonographic Murphy sign. Acute cholecystitis was thought to be possible. Fatty liver noted. Urinalysis was clear. Hemoglobin 15.2. WBC 9300. Liver function tests normal. Creatinine 1.01. Glucose 153. Lipase 55 borderline elevated. He went home from the ER and continued to have some mild postprandial discomfort for a couple of days but that has subsided. He saw Dr. Chestine Sporelark at [facility]. He was advised to have an aortobifemoral bypass graft. Dr. Chestine Sporelark advised him to have his gallbladder surgery first to avoid graft infection, which is of course appropriate       Comorbidities include former tobacco use. Quit in 2016. NSTEMI and CABG 2016 Dr. Laneta SimmersBartle Dr. Elease HashimotoNahser. GERD. Non-insulin-dependent diabetes. Hypertension. Hyperlipidemia.     I strongly suspect that  his right upper quadrant pain is due to biliary tract disease. I agree that he needs cholecystectomy as a prelude to aortobifemoral bypass graft He will be scheduled for laparoscopic cholecystectomy with cholangiogram I asked him to stop his aspirin 3-5 days preop  Operative Findings:       The gallbladder was relatively small but chronically inflamed.  Gallbladder wall was thickened and discolored.  The intraoperative cholangiogram was normal, showing normal intrahepatic and extrahepatic biliary anatomy, no filling defect, and no obstruction with good flow of contrast into the duodenum.  The gallbladder bed was inflamed and required a little bit of extra cautery.  The liver looked normal.  Four-quadrant inspection revealed no other visceral abnormality and no sign of injury.  We had a small arterial bleeder in the midline just above the umbilicus which was controlled with cautery.  This was encountered as we placed the first trocar.  There was no further bleeding during the case.  Procedure in Detail:          Following the induction of general endotracheal anesthesia the patient's abdomen was prepped and draped in a sterile fashion.  Intravenous antibiotics were given.  Surgical timeout was performed.  0.25% Marcaine with epinephrine was used as a local infiltration  anesthetic.      A vertical incision was made in the midline just above the umbilicus.  The fascia was incised in the midline.  There was an arterial bleeder at the edge of the fascia which was controlled with cautery.  The abdomen was entered under direct vision.  An 11 mm Hassan trocar was inserted with the pursestring suture of 0 Vicryl.  Pneumoperitoneum was created and video camera was inserted.  A trocar was placed in the subxiphoid region through 1 of his old chest tube sites.  2 trochars were placed in the right upper quadrant.  After four-quadrant inspection we elevated the fundus of the gallbladder.  There was some soft adhesions to  the body and infundibulum of the gallbladder and these were taken down and we slowly defined the cystic artery and the cystic duct.  The cystic artery was controlled with metal clips and divided as it went on to the wall of the gallbladder.  A cystic duct lymph node was teased away.  We slowly created a large window behind the cystic duct.  Cholangiogram catheter was inserted into the cystic duct and a cholangiogram was obtained using the C arm.  The cholangiogram was normal.  The cholangiogram catheter was removed, the cystic duct was secured with multiple metal clips and divided.     The gallbladder was then dissected from its bed with electrocautery, placed in a specimen bag and removed.  The operative field was irrigated.  I cauterized the raw surfaces of the gallbladder bed.  Irrigation fluid was completely clear and there was no bleeding or bile leak.  The pneumoperitoneum was released and the trochars were removed.  The fascia at the umbilicus was closed with 0 Vicryl sutures.  The skin was closed with subcuticular sutures of 4-0 Monocryl and Dermabond.  Patient tolerated the procedure well and was taken to PACU in stable condition.  EBL 20 cc or less.  Counts correct.  Complications none.   Addendum: I logged onto the [employer] website and reviewed his prescription medication history     Jerzee Jerome M. Derrell LollingIngram, M.D., FACS General and Minimally Invasive Surgery Breast and Colorectal Surgery  12/31/2017 8:43 AM

## 2017-12-31 NOTE — Interval H&P Note (Signed)
History and Physical Interval Note:  12/31/2017 5:10 AM  Logan Reynolds  has presented today for surgery, with the diagnosis of symptomatic gall bladder disease  The various methods of treatment have been discussed with the patient and family. After consideration of risks, benefits and other options for treatment, the patient has consented to  Procedure(s): LAPAROSCOPIC CHOLECYSTECTOMY WITH INTRAOPERATIVE CHOLANGIOGRAM ERAS PATHWAY (N/A) as a surgical intervention .  The patient's history has been reviewed, patient examined, no change in status, stable for surgery.  I have reviewed the patient's chart and labs.  Questions were answered to the patient's satisfaction.     Ernestene MentionHaywood M Janila Arrazola

## 2017-12-31 NOTE — Anesthesia Postprocedure Evaluation (Signed)
Anesthesia Post Note  Patient: Logan Reynolds  Procedure(s) Performed: LAPAROSCOPIC CHOLECYSTECTOMY WITH INTRAOPERATIVE CHOLANGIOGRAM (N/A Abdomen)     Patient location during evaluation: PACU Anesthesia Type: General Level of consciousness: awake and alert and oriented Pain management: pain level controlled Vital Signs Assessment: post-procedure vital signs reviewed and stable Respiratory status: spontaneous breathing, nonlabored ventilation and respiratory function stable Cardiovascular status: blood pressure returned to baseline and stable Postop Assessment: no apparent nausea or vomiting Anesthetic complications: no    Last Vitals:  Vitals:   12/31/17 0920 12/31/17 0935  BP: 101/71 107/66  Pulse: 69 65  Resp: 15 11  Temp:  36.5 C  SpO2: 95% 97%    Last Pain:  Vitals:   12/31/17 0935  TempSrc:   PainSc: 3                  Justan Gaede A.

## 2017-12-31 NOTE — Discharge Instructions (Signed)
[facility] ______[facility] °LAPAROSCOPIC SURGERY: POST OP INSTRUCTIONS °Always review your discharge instruction sheet given to you by the facility where your surgery was performed. °IF YOU HAVE DISABILITY OR FAMILY LEAVE FORMS, YOU MUST BRING THEM TO THE OFFICE FOR PROCESSING.   °DO NOT GIVE THEM TO YOUR DOCTOR. ° °1. A prescription for pain medication may be given to you upon discharge.  Take your pain medication as prescribed, if needed.  If narcotic pain medicine is not needed, then you may take acetaminophen (Tylenol) or ibuprofen (Advil) as needed. °2. Take your usually prescribed medications unless otherwise directed. °3. If you need a refill on your pain medication, please contact your pharmacy.  They will contact our office to request authorization. Prescriptions will not be filled after 5pm or on week-ends. °4. You should follow a light diet the first few days after arrival home, such as soup and crackers, etc.  Be sure to include lots of fluids daily. °5. Most patients will experience some swelling and bruising in the area of the incisions.  Ice packs will help.  Swelling and bruising can take several days to resolve.  °6. It is common to experience some constipation if taking pain medication after surgery.  Increasing fluid intake and taking a stool softener (such as Colace) will usually help or prevent this problem from occurring.  A mild laxative (Milk of Magnesia or Miralax) should be taken according to package instructions if there are no bowel movements after 48 hours. °7. Unless discharge instructions indicate otherwise, you may remove your bandages 24-48 hours after surgery, and you may shower at that time.  You may have steri-strips (small skin tapes) in place directly over the incision.  These strips should be left on the skin for 7-10 days.  If your surgeon used skin glue on the incision, you may shower in 24 hours.  The glue will flake off over the next 2-3 weeks.  Any sutures or  staples will be removed at the office during your follow-up visit. °8. ACTIVITIES:  You may resume regular (light) daily activities beginning the next day--such as daily self-care, walking, climbing stairs--gradually increasing activities as tolerated.  You may have sexual intercourse when it is comfortable.  Refrain from any heavy lifting or straining until approved by your doctor. °a. You may drive when you are no longer taking prescription pain medication, you can comfortably wear a seatbelt, and you can safely maneuver your car and apply brakes. °b. RETURN TO WORK:  __________________________________________________________ °9. You should see your doctor in the office for a follow-up appointment approximately 2-3 weeks after your surgery.  Make sure that you call for this appointment within a day or two after you arrive home to insure a convenient appointment time. °10. OTHER INSTRUCTIONS: __________________________________________________________________________________________________________________________ __________________________________________________________________________________________________________________________ °WHEN TO CALL YOUR DOCTOR: °1. Fever over 101.0 °2. Inability to urinate °3. Continued bleeding from incision. °4. Increased pain, redness, or drainage from the incision. °5. Increasing abdominal pain ° °The clinic staff is available to answer your questions during regular business hours.  Please don’t hesitate to call and ask to speak to one of the nurses for clinical concerns.  If you have a medical emergency, go to the nearest emergency room or call 911.  A surgeon from [facility] is always on call at the hospital. °[address], [address], [place], [place]  [address] ? [address] [address], [place], [place]   [address] °[phone] ? [phone] ? [phone] [phone] °Web site:   [email or web address] °

## 2017-12-31 NOTE — Transfer of Care (Signed)
Immediate Anesthesia Transfer of Care Note  Patient: Logan Reynolds  Procedure(s) Performed: LAPAROSCOPIC CHOLECYSTECTOMY WITH INTRAOPERATIVE CHOLANGIOGRAM (N/A Abdomen)  Patient Location: PACU  Anesthesia Type:General  Level of Consciousness: awake and alert   Airway & Oxygen Therapy: Patient Spontanous Breathing and Patient connected to nasal cannula oxygen  Post-op Assessment: Report given to RN and Post -op Vital signs reviewed and stable  Post vital signs: Reviewed and stable  Last Vitals:  Vitals Value Taken Time  BP 118/75 12/31/2017  8:50 AM  Temp 36.2 C 12/31/2017  8:50 AM  Pulse 74 12/31/2017  8:53 AM  Resp 13 12/31/2017  8:53 AM  SpO2 99 % 12/31/2017  8:53 AM  Vitals shown include unvalidated device data.  Last Pain:  Vitals:   12/31/17 0850  TempSrc:   PainSc: (P) 0-No pain      Patients Stated Pain Goal: 2 (12/31/17 0556)  Complications: No apparent anesthesia complications

## 2018-01-01 ENCOUNTER — Encounter (HOSPITAL_COMMUNITY): Payer: Self-pay | Admitting: General Surgery

## 2018-01-10 ENCOUNTER — Other Ambulatory Visit: Payer: Self-pay

## 2018-01-10 DIAGNOSIS — I7410 Embolism and thrombosis of unspecified parts of aorta: Principal | ICD-10-CM

## 2018-01-10 DIAGNOSIS — I700 Atherosclerosis of aorta: Secondary | ICD-10-CM

## 2018-01-22 ENCOUNTER — Ambulatory Visit (HOSPITAL_COMMUNITY)
Admission: RE | Admit: 2018-01-22 | Discharge: 2018-01-22 | Disposition: A | Discharge status: Home / Self Care | Payer: BLUE CROSS/BLUE SHIELD | Source: Ambulatory Visit | Attending: Vascular Surgery | Admitting: Vascular Surgery

## 2018-01-22 ENCOUNTER — Ambulatory Visit (INDEPENDENT_AMBULATORY_CARE_PROVIDER_SITE_OTHER): Payer: BLUE CROSS/BLUE SHIELD | Admitting: Vascular Surgery

## 2018-01-22 ENCOUNTER — Other Ambulatory Visit: Payer: Self-pay

## 2018-01-22 ENCOUNTER — Encounter: Payer: Self-pay | Admitting: Vascular Surgery

## 2018-01-22 VITALS — BP 111/69 | HR 67 | Resp 18 | Ht 72.0 in | Wt 198.0 lb

## 2018-01-22 DIAGNOSIS — I7410 Embolism and thrombosis of unspecified parts of aorta: Secondary | ICD-10-CM

## 2018-01-22 DIAGNOSIS — I7409 Other arterial embolism and thrombosis of abdominal aorta: Secondary | ICD-10-CM

## 2018-01-22 DIAGNOSIS — I700 Atherosclerosis of aorta: Secondary | ICD-10-CM

## 2018-01-22 NOTE — Progress Notes (Signed)
Patient name: Logan Reynolds MRN: [id number] DOB: [birth year] Sex: male  REASON FOR CONSULT: Aortoiliac occlusion  HPI: Logan Reynolds is a 53 y.o. male, with history of hypertension, HLD, tobacco abuse, DM II, CAD s/p NSTEMI in 2016 that presents for follow-up of aortoiliac occlusion noted on CT at  [facility] ED during work-up for right upper quadrant abdominal pain in early December. During the ED visit a CT abdomen pelvis was obtained where they noted an aortic occlusion just below his renal arteries with reconstitution of his common femorals and a referral to vascular surgery was placed.  He has since had his gallbladder removed by Dr. Derrell Lolling.  He has had no more episodes of abdominal pain.  He presents to discuss his aortic occlusion today.  He denies any rest pain in his feet and denies any tissue loss.  Still has just claudication symptoms and states he can still walk up about 40 stairs and or nearly 1 mile before his symptoms become limiting.  He does not feel the symptoms have progressed since his evaluation in December.  Past Medical History:  Diagnosis Date  . Anxiety   . Chronic cholecystitis 12/31/2017  . Diabetes mellitus, type 2 (HCC)   . GERD (gastroesophageal reflux disease)   . HTN (hypertension)   . Myocardial infarction (HCC)    2016  . Tobacco abuse     Past Surgical History:  Procedure Laterality Date  . CARDIAC CATHETERIZATION  2006  . CARDIAC CATHETERIZATION N/A 01/07/2015   Procedure: Left Heart Cath and Coronary Angiography;  Surgeon: Marykay Lex, MD;  Location: [facility] INVASIVE CV LAB;  Service: Cardiovascular;  Laterality: N/A;  . CHOLECYSTECTOMY N/A 12/31/2017   Procedure: LAPAROSCOPIC CHOLECYSTECTOMY WITH INTRAOPERATIVE CHOLANGIOGRAM;  Surgeon: Claud Kelp, MD;  Location: [facility] OR;  Service: General;  Laterality: N/A;  . CORONARY ARTERY BYPASS GRAFT N/A 01/07/2015   Procedure: CORONARY ARTERY BYPASS GRAFTING (CABG)x 4 using left internal mammary  artery and right saphenous leg vein.;  Surgeon: Alleen Borne, MD;  Location: [facility] OR;  Service: Open Heart Surgery;  Laterality: N/A;  . KNEE ARTHROSCOPY      Family History  Problem Relation Age of Onset  . Cancer Mother        ovarian  . Cancer Father        bone cancer  . Heart attack Father        CABG x5V  . Heart disease Father   . Diabetes Maternal Grandmother   . Diabetes Brother     SOCIAL HISTORY: Social History   Socioeconomic History  . Marital status: Married    Spouse name: Not on file  . Number of children: Not on file  . Years of education: Not on file  . Highest education level: Not on file  Occupational History  . Not on file  Social Needs  . Financial resource strain: Not on file  . Food insecurity:    Worry: Not on file    Inability: Not on file  . Transportation needs:    Medical: Not on file    Non-medical: Not on file  Tobacco Use  . Smoking status: Former Smoker    Packs/day: 1.50    Years: 25.00    Pack years: 37.50    Types: Cigarettes  . Smokeless tobacco: Never Used  Substance and Sexual Activity  . Alcohol use: No    Alcohol/week: 0.0 standard drinks    Comment: quit about 15 years ago  .  Drug use: No  . Sexual activity: Not on file  Lifestyle  . Physical activity:    Days per week: Not on file    Minutes per session: Not on file  . Stress: Not on file  Relationships  . Social connections:    Talks on phone: Not on file    Gets together: Not on file    Attends religious service: Not on file    Active member of club or organization: Not on file    Attends meetings of clubs or organizations: Not on file    Relationship status: Not on file  . Intimate partner violence:    Fear of current or ex partner: Not on file    Emotionally abused: Not on file    Physically abused: Not on file    Forced sexual activity: Not on file  Other Topics Concern  . Not on file  Social History Narrative  . Not on file    No Known  Allergies  Current Outpatient Medications  Medication Sig Dispense Refill  . acetaminophen (TYLENOL) 500 MG tablet Take 500-1,000 mg by mouth every 6 (six) hours as needed (PAIN.).    Marland Kitchen. ALPRAZolam (XANAX) 0.5 MG tablet Take 0.5 mg by mouth 3 (three) times daily as needed for anxiety.    Marland Kitchen. aspirin 81 MG EC tablet Take 1 tablet (81 mg total) by mouth daily. (Patient taking differently: Take 81 mg by mouth 2 (two) times daily. )    . JARDIANCE 25 MG TABS tablet Take 25 mg by mouth daily.  2  . lisinopril (PRINIVIL,ZESTRIL) 2.5 MG tablet Take 2.5 mg by mouth daily.    . meloxicam (MOBIC) 15 MG tablet Take 15 mg by mouth daily.  1  . metFORMIN (GLUCOPHAGE) 500 MG tablet Take 1,000 mg by mouth 2 (two) times daily with a meal.    . metoprolol tartrate (LOPRESSOR) 25 MG tablet Take 1 tablet (25 mg total) by mouth 2 (two) times daily. 60 tablet 1  . omeprazole (PRILOSEC) 40 MG capsule Take 40 mg by mouth every evening.   1  . OZEMPIC 0.25 or 0.5 MG/DOSE SOPN Inject 0.25 Doses as directed every Sunday.     . rosuvastatin (CRESTOR) 40 MG tablet TAKE 1 TABLET BY MOUTH EVERY DAY (Patient taking differently: Take 40 mg by mouth daily. ) 90 tablet 2   No current facility-administered medications for this visit.     REVIEW OF SYSTEMS:  [X]  denotes positive finding, [ ]  denotes negative finding Cardiac  Comments:  Chest pain or chest pressure:    Shortness of breath upon exertion:    Short of breath when lying flat:    Irregular heart rhythm:        Vascular    Pain in calf, thigh, or hip brought on by ambulation: x   Pain in feet at night that wakes you up from your sleep:     Blood clot in your veins:    Leg swelling:         Pulmonary    Oxygen at home:    Productive cough:     Wheezing:         Neurologic    Sudden weakness in arms or legs:     Sudden numbness in arms or legs:     Sudden onset of difficulty speaking or slurred speech:    Temporary loss of vision in one eye:     Problems  with dizziness:  Gastrointestinal    Blood in stool:     Vomited blood:     Right upper quadrant abdominal pain    Genitourinary    Burning when urinating:     Blood in urine:        Psychiatric    Major depression:         Hematologic    Bleeding problems:    Problems with blood clotting too easily:        Skin    Rashes or ulcers:        Constitutional    Fever or chills:      PHYSICAL EXAM: Vitals:   01/22/18 0841  BP: 111/69  Pulse: 67  Resp: 18  SpO2: 98%  Weight: 198 lb (89.8 kg)  Height: 6' (1.829 m)    GENERAL: The patient is a well-nourished male, in no acute distress. The vital signs are documented above. CARDIAC: There is a regular rate and rhythm.  VASCULAR:  No palpable femoral pulses Monophasic PT signals bilateral lower extremities No tissue loss or motor or sensory loss to either lower extremity PULMONARY: There is good air exchange bilaterally without wheezing or rales. ABDOMEN: Soft and non-tender with normal pitched bowel sounds.  MUSCULOSKELETAL: There are no major deformities or cyanosis. NEUROLOGIC: No focal weakness or paresthesias are detected.   DATA:   I previously independently reviewed his CT abdomen and pelvis which shows an occlusion of his infrarenal aorta with occlusion of the bilateral common iliac arteries that are fairly calcified and reconstitution of bilateral common femoral arteries.    His ABIs today are 0.6 on the right 0.54 on the left monophasic waveforms.  Assessment/Plan:  53 year old male who presents for follow-up of a chronic infrarenal aortic occlusion in the setting of intermittent lower extremity claudication.  He appears to be recovering well following his cholecystectomy by Dr. Derrell LollingIngram.  His claudication symptoms are stable since initial evaluation in December.  He has no tissue loss or rest pain symptoms.  Ultimately we had a long discussion today about the nature of aortobifemoral bypass.  He does not  feel that his symptoms are lifestyle limiting at this time and is able to work and states he can walk up to a mile before having to stop.  Ultimately we agreed on continued observation for now given his symptoms are very tolerable.  I will plan to see him back in 6 months.  He does have my business card and discussed contacting my office and let them you know if his symptoms progress in the interim.  I told him at some point he may find that his symptoms progress to the point that he wants to pursue aortobifemoral bypass.`   Cephus Shellinghristopher J. Jayke Caul, MD [facility] Office: [phone] Pager: [phone]   Cephus Shellinghristopher J Khalea [place]

## 2018-01-29 ENCOUNTER — Other Ambulatory Visit: Payer: BLUE CROSS/BLUE SHIELD

## 2018-02-01 ENCOUNTER — Other Ambulatory Visit: Payer: BLUE CROSS/BLUE SHIELD

## 2018-02-08 ENCOUNTER — Other Ambulatory Visit: Payer: BLUE CROSS/BLUE SHIELD | Admitting: *Deleted

## 2018-02-08 DIAGNOSIS — I25119 Atherosclerotic heart disease of native coronary artery with unspecified angina pectoris: Secondary | ICD-10-CM

## 2018-02-08 DIAGNOSIS — E782 Mixed hyperlipidemia: Secondary | ICD-10-CM

## 2018-02-08 LAB — HEPATIC FUNCTION PANEL
ALT: 66 IU/L — AB (ref 0–44)
AST: 44 IU/L — ABNORMAL HIGH (ref 0–40)
Albumin: 4.6 g/dL (ref 3.8–4.9)
Alkaline Phosphatase: 68 IU/L (ref 39–117)
Bilirubin Total: 0.6 mg/dL (ref 0.0–1.2)
Bilirubin, Direct: 0.17 mg/dL (ref 0.00–0.40)
Total Protein: 7.1 g/dL (ref 6.0–8.5)

## 2018-02-08 LAB — BASIC METABOLIC PANEL
BUN/Creatinine Ratio: 16 (ref 9–20)
BUN: 18 mg/dL (ref 6–24)
CO2: 23 mmol/L (ref 20–29)
Calcium: 9.2 mg/dL (ref 8.7–10.2)
Chloride: 99 mmol/L (ref 96–106)
Creatinine, Ser: 1.11 mg/dL (ref 0.76–1.27)
GFR, EST AFRICAN AMERICAN: 87 mL/min/{1.73_m2} (ref >59)
GFR, EST NON AFRICAN AMERICAN: 75 mL/min/{1.73_m2} (ref >59)
Glucose: 200 mg/dL — ABNORMAL HIGH (ref 65–99)
Potassium: 4.7 mmol/L (ref 3.5–5.2)
Sodium: 139 mmol/L (ref 134–144)

## 2018-02-08 LAB — LIPID PANEL
CHOLESTEROL TOTAL: 79 mg/dL — AB (ref 100–199)
Chol/HDL Ratio: 2.3 ratio (ref 0.0–5.0)
HDL: 34 mg/dL — ABNORMAL LOW (ref >39)
LDL Calculated: 22 mg/dL (ref 0–99)
Triglycerides: 114 mg/dL (ref 0–149)
VLDL Cholesterol Cal: 23 mg/dL (ref 5–40)

## 2018-07-23 ENCOUNTER — Encounter (HOSPITAL_COMMUNITY): Payer: BLUE CROSS/BLUE SHIELD

## 2018-07-23 ENCOUNTER — Ambulatory Visit: Payer: BLUE CROSS/BLUE SHIELD | Admitting: Vascular Surgery

## 2018-09-19 NOTE — Progress Notes (Signed)
Cardiology Office Note   Date:  09/20/2018   ID:  Logan Mcgeeavid Ray Reynolds, DOB 16-Jan-1965, MRN [id number]  PCP:  Kirstie PeriShah, Ashish, MD  Cardiologist:  Dr. Elease HashimotoNahser   Problem List 1. CAD - s/p emergent CABG  01-07-15 2. DM 3. Post op atrial fib 4.   Post-hospital follow up- emergent CABG    Logan Reynolds is a 53 y.o. male with a history of HTN, tobacco abuse, DMT2 and CAD s/p recent emergent CABG who presents to clinic for post hospital follow up.   [facility] with chest pain in 12/2014. He ruled in for NSTEMI and was [facility] for [facility] evalation and treatment. He underwent cath 01/07/15 which showed 90% distal LM, 100% proximal to mid LAD with 99% lesion in two diagonals, large OM system without disease and RCA without disease as well as mild LV dysfunction. Due to these findings emergent cardiothoracic surgical consultation was obtained with Evelene CroonBryan Bartle M.D. who evaluated the patient and his studies and agreed with recommendations to proceed with emergent coronary artery surgical revascularization. He underwent CABG x4V (LIMA--> LAD, SVG--> D1, SVG--> D2, SVG--> OM) on 01/07/15. His post-op course was complicated by aflutter with RVR. He was given IV Lopressor and he converted to sinus rhythm. He was then put on oral Amiodarone. He has remained in sinus rhythm and is surgically stable for discharge 01/12/15.  April 30, 2015: Pt was seen by Carlean JewsKatie Thompson several weeks ago .  Still has some fatigue . 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 20, 2017:  Logan Reynolds is recovering well. Still has chest wall tenderness.  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   Jun 02, 2016:  Logan Reynolds is seen for follow up of his CAD Diabetes is not well controlled yet [place] PepperJardiance was started.   No CP or dyspnea.    Dec. 7, 2018:   Logan Reynolds is doing well.  He it has been 2 years since his bypass surgery.  He stopped smoking at that time.  He still working on getting his glucose levels under control. He is very active at his work.  He works as an Personnel officerelectrician.  He does not get much aerobic exercise.  He admits that he has a treadmill at home that he does not use.  July 27, 2017:  Logan Reynolds is doing well.  Seen with wife, Logan Reynolds .    Working hard without limitations -  Having some left chest skin numbness.   Was on Lipitor - caused some liver enz elevation Tolerating the crestor 40  - liver enz look great.  Has leg cramping when he is working out in the heat .    Sept. 11, 2020   Logan Reynolds is seen for follow up of CAD / CABG.   Has  Hyperlipidemia  Has had a cholecystectomy since I last saw him ( Dec. 2019)   Was also found to have an occluded distal aorta - has seen Dr. Chestine Sporelark.   The plan is for conservative observation and to return to see Dr. Chestine Sporelark if he develops symptoms of claudicatino. No angina .   Labs from Jan. 2020 look great .  Glucose is still elevated. - 200    Wt Readings from Last 3 Encounters:  09/20/18 204 lb 12.8 oz (92.9  kg)  01/22/18 198 lb (89.8 kg)  12/31/17 198 lb (89.8 kg)      Past Medical History:  Diagnosis Date  . Anxiety   . Chronic cholecystitis 12/31/2017  . Diabetes mellitus, type 2 (HCC)   . GERD (gastroesophageal reflux disease)   . HTN (hypertension)   . Myocardial infarction (HCC)    2016  . Tobacco abuse     Past Surgical History:  Procedure Laterality Date  . CARDIAC CATHETERIZATION  2006  . CARDIAC CATHETERIZATION N/A 01/07/2015   Procedure: Left Heart Cath and Coronary Angiography;  Surgeon: Marykay Lex, MD;  [facility] INVASIVE CV LAB;  Service: Cardiovascular;  Laterality: N/A;  . CHOLECYSTECTOMY N/A 12/31/2017   Procedure: LAPAROSCOPIC CHOLECYSTECTOMY WITH INTRAOPERATIVE CHOLANGIOGRAM;  Surgeon: Claud Kelp, MD;  Location: [facility] OR;  Service: General;   Laterality: N/A;  . CORONARY ARTERY BYPASS GRAFT N/A 01/07/2015   Procedure: CORONARY ARTERY BYPASS GRAFTING (CABG)x 4 using left internal mammary artery and right saphenous leg vein.;  Surgeon: Alleen Borne, MD;  Location: [facility] OR;  Service: Open Heart Surgery;  Laterality: N/A;  . KNEE ARTHROSCOPY       Current Outpatient Medications  Medication Sig Dispense Refill  . acetaminophen (TYLENOL) 500 MG tablet Take 500-1,000 mg by mouth every 6 (six) hours as needed (PAIN.).    Marland Kitchen ALPRAZolam (XANAX) 0.5 MG tablet Take 0.5 mg by mouth 3 (three) times daily as needed for anxiety.    Marland Kitchen aspirin 81 MG EC tablet Take 1 tablet (81 mg total) by mouth daily.    Marland Kitchen JARDIANCE 25 MG TABS tablet Take 25 mg by mouth daily.  2  . lisinopril (PRINIVIL,ZESTRIL) 2.5 MG tablet Take 2.5 mg by mouth daily.    . meloxicam (MOBIC) 15 MG tablet Take 15 mg by mouth daily.  1  . metFORMIN (GLUCOPHAGE) 500 MG tablet Take 1,000 mg by mouth 2 (two) times daily with a meal.    . metoprolol tartrate (LOPRESSOR) 25 MG tablet Take 1 tablet (25 mg total) by mouth 2 (two) times daily. 60 tablet 1  . omeprazole (PRILOSEC) 40 MG capsule Take 40 mg by mouth every evening.   1  . OZEMPIC, 1 MG/DOSE, 2 MG/1.5ML SOPN Inject 1 mg into the skin once a week.    . rosuvastatin (CRESTOR) 40 MG tablet TAKE 1 TABLET BY MOUTH EVERY DAY 90 tablet 2   No current facility-administered medications for this visit.     Allergies:   Patient has no known allergies.    Social History:  The patient  reports that he has quit smoking. His smoking use included cigarettes. He has a 37.50 pack-year smoking history. He has never used smokeless tobacco. He reports that he does not drink alcohol or use drugs.   Family History:  The patient's family history includes Cancer in his father and mother; Diabetes in his brother and maternal grandmother; Heart attack in his father; Heart disease in his father.    ROS:  Please see the history of present illness.    Otherwise, review of systems are positive for NONE.   All other systems are reviewed and negative.   Physical Exam: Blood pressure 108/70, pulse 70, height 6' (1.829 m), weight 204 lb 12.8 oz (92.9 kg), SpO2 98 %.  GEN:  Well nourished, well developed in no acute distress HEENT: Normal NECK: No JVD; No carotid bruits LYMPHATICS: No lymphadenopathy CARDIAC: RR  RESPIRATORY:  Clear to auscultation without rales, wheezing or rhonchi  ABDOMEN: Soft, non-tender, non-distended MUSCULOSKELETAL:  No edema; No deformity  SKIN: Warm and dry NEUROLOGIC:  Alert and oriented x 3   EKG:  Sept. 11, 2020 >   NSR at 70 .  Normal ECG   Recent Labs: 12/28/2017: Hemoglobin 14.4; Platelets 310 02/08/2018: ALT 66; BUN 18; Creatinine, Ser 1.11; Potassium 4.7; Sodium 139    Lipid Panel    Component Value Date/Time   CHOL 79 (L) 02/08/2018 1033   TRIG 114 02/08/2018 1033   HDL 34 (L) 02/08/2018 1033   CHOLHDL 2.3 02/08/2018 1033   CHOLHDL 1.9 10/29/2015 1129   VLDL 22 10/29/2015 1129   LDLCALC 22 02/08/2018 1033      Wt Readings from Last 3 Encounters:  09/20/18 204 lb 12.8 oz (92.9 kg)  01/22/18 198 lb (89.8 kg)  12/31/17 198 lb (89.8 kg)      Other studies Reviewed: Additional studies/ records that were reviewed today include: [facility] Review of the above records demonstrates: see HPI   ASSESSMENT AND PLAN:  Logan Reynolds is a 53 y.o. y.o. male with a history of HTN, tobacco abuse, DMT2 and CAD s/p recent emergent CABG who presents to clinic for post hospital follow up.   CAD/NSTEMI s/p recent emergent CABG x4V No angina .   Seems to be doing well  2.  PAD :   Has an occluded distal aorta.  Has seen Dr. Carlis Abbott.  No significant claudication .   He will call Dr. Carlis Abbott if he develops any symptoms of claudication.  Hyperlipidemia: His last labs in January, 2020 look great.  We will check labs today.  Continue current Crestor dose.  Post op afib: No further evidence of atrial  fibrillation.  HTN: Blood pressure is well controlled.  Continue current medications.  Tobacco abuse: has quit smoking .      Signed, Mertie Moores, MD  09/20/2018 9:57 AM    [facility] [address], [place], [place]  [address] Phone: [phone]; Fax: [phone]

## 2018-09-20 ENCOUNTER — Ambulatory Visit: Payer: BC Managed Care – PPO | Admitting: Cardiovascular Disease

## 2018-09-20 ENCOUNTER — Encounter: Payer: Self-pay | Admitting: Cardiovascular Disease

## 2018-09-20 ENCOUNTER — Other Ambulatory Visit: Payer: Self-pay

## 2018-09-20 VITALS — BP 108/70 | HR 70 | Ht 72.0 in | Wt 204.8 lb

## 2018-09-20 DIAGNOSIS — E781 Pure hyperglyceridemia: Secondary | ICD-10-CM

## 2018-09-20 DIAGNOSIS — I25119 Atherosclerotic heart disease of native coronary artery with unspecified angina pectoris: Secondary | ICD-10-CM

## 2018-09-20 DIAGNOSIS — I7409 Other arterial embolism and thrombosis of abdominal aorta: Secondary | ICD-10-CM

## 2018-09-20 DIAGNOSIS — E782 Mixed hyperlipidemia: Secondary | ICD-10-CM | POA: Diagnosis not present

## 2018-09-20 DIAGNOSIS — I2510 Atherosclerotic heart disease of native coronary artery without angina pectoris: Secondary | ICD-10-CM

## 2018-09-20 DIAGNOSIS — I10 Essential (primary) hypertension: Secondary | ICD-10-CM

## 2018-09-20 LAB — HEPATIC FUNCTION PANEL
ALT: 44 IU/L (ref 0–44)
AST: 35 IU/L (ref 0–40)
Albumin: 4.7 g/dL (ref 3.8–4.9)
Alkaline Phosphatase: 56 IU/L (ref 39–117)
Bilirubin Total: 0.5 mg/dL (ref 0.0–1.2)
Bilirubin, Direct: 0.2 mg/dL (ref 0.00–0.40)
Total Protein: 7.3 g/dL (ref 6.0–8.5)

## 2018-09-20 LAB — BASIC METABOLIC PANEL
BUN/Creatinine Ratio: 12 (ref 9–20)
BUN: 13 mg/dL (ref 6–24)
CO2: 22 mmol/L (ref 20–29)
Calcium: 10.2 mg/dL (ref 8.7–10.2)
Chloride: 102 mmol/L (ref 96–106)
Creatinine, Ser: 1.1 mg/dL (ref 0.76–1.27)
GFR calc Af Amer: 87 mL/min/{1.73_m2} (ref >59)
GFR calc non Af Amer: 75 mL/min/{1.73_m2} (ref >59)
Glucose: 179 mg/dL — ABNORMAL HIGH (ref 65–99)
Potassium: 4.9 mmol/L (ref 3.5–5.2)
Sodium: 138 mmol/L (ref 134–144)

## 2018-09-20 LAB — LIPID PANEL
Chol/HDL Ratio: 2.5 ratio (ref 0.0–5.0)
Cholesterol, Total: 84 mg/dL — ABNORMAL LOW (ref 100–199)
HDL: 34 mg/dL — ABNORMAL LOW (ref >39)
LDL Chol Calc (NIH): 30 mg/dL (ref 0–99)
Triglycerides: 104 mg/dL (ref 0–149)
VLDL Cholesterol Cal: 20 mg/dL (ref 5–40)

## 2018-09-20 NOTE — Patient Instructions (Signed)
Medication Instructions:  Your physician recommends that you continue on your current medications as directed. Please refer to the Current Medication list given to you today.  If you need a refill on your cardiac medications before your next appointment, please call your pharmacy.   Lab work: Your physician recommends lab work today: Liver enzymes, Cholesterol, BMET  If you have labs (blood work) drawn today and your tests are completely normal, you will receive your results only by: Marland Kitchen MyChart Message (if you have MyChart) OR . A paper copy in the mail If you have any lab test that is abnormal or we need to change your treatment, we will call you to review the results.  Testing/Procedures: None Ordered  Follow-Up: At [facility], you and your health needs are our priority.  As part of our continuing mission to provide you with exceptional heart care, we have created designated Provider Care Teams.  These Care Teams include your primary Cardiologist (physician) and Advanced Practice Providers (APPs -  Physician Assistants and Nurse Practitioners) who all work together to provide you with the care you need, when you need it. You will need a follow up appointment in:  1 years.  Please call our office 2 months in advance to schedule this appointment.  You may see Mertie Moores, MD or one of the following Advanced Practice Providers on your designated Care Team: Richardson Dopp, PA-C [place], [place] . Daune Perch, NP

## 2019-03-15 ENCOUNTER — Ambulatory Visit: Payer: BC Managed Care – PPO | Attending: Internal Medicine

## 2019-03-15 ENCOUNTER — Other Ambulatory Visit: Payer: Self-pay

## 2019-03-15 DIAGNOSIS — Z23 Encounter for immunization: Secondary | ICD-10-CM | POA: Insufficient documentation

## 2019-03-15 NOTE — Progress Notes (Signed)
   Covid-19 Vaccination Clinic  Name:  Logan Reynolds    MRN: [id number] DOB: [birth year]  03/15/2019  Mr. [name] was observed post Covid-19 immunization for 15 minutes without incident. He was provided with Vaccine Information Sheet and instruction to access the V-Safe system.   Mr. [name] was instructed to call 911 with any severe reactions post vaccine: Marland Kitchen Difficulty breathing  . Swelling of face and throat  . A fast heartbeat  . A bad rash all over body  . Dizziness and weakness   Immunizations Administered    Name Date Dose VIS Date Route   Moderna COVID-19 Vaccine 03/15/2019  9:33 AM 0.5 mL 12/10/2018 Intramuscular   Manufacturer: Moderna   Lot: [id number]   NDC: 63893-734-28

## 2019-04-16 ENCOUNTER — Ambulatory Visit: Payer: BC Managed Care – PPO | Attending: Internal Medicine

## 2019-04-16 DIAGNOSIS — Z23 Encounter for immunization: Secondary | ICD-10-CM

## 2019-04-16 NOTE — Progress Notes (Signed)
   Covid-19 Vaccination Clinic  Name:  Logan Reynolds    MRN: [id number] DOB: 30-Jun-1965  04/16/2019  Mr. Sitter was observed post Covid-19 immunization for 15 minutes without incident. He was provided with Vaccine Information Sheet and instruction to access the V-Safe system.   [name] was instructed to call 911 with any severe reactions post vaccine: Marland Kitchen Difficulty breathing  . Swelling of face and throat  . A fast heartbeat  . A bad rash all over body  . Dizziness and weakness   Immunizations Administered    Name Date Dose VIS Date Route   Moderna COVID-19 Vaccine 04/16/2019  3:04 PM 0.5 mL 12/10/2018 Intramuscular   Manufacturer: Gala Murdoch   Lot: [id number]   NDC: 41962-229-79

## 2019-09-12 ENCOUNTER — Ambulatory Visit: Payer: BC Managed Care – PPO | Admitting: Cardiovascular Disease

## 2019-09-12 ENCOUNTER — Other Ambulatory Visit: Payer: Self-pay

## 2019-09-12 ENCOUNTER — Encounter: Payer: Self-pay | Admitting: Cardiovascular Disease

## 2019-09-12 VITALS — BP 106/66 | HR 67 | Ht 72.0 in | Wt 199.2 lb

## 2019-09-12 DIAGNOSIS — I739 Peripheral vascular disease, unspecified: Secondary | ICD-10-CM

## 2019-09-12 DIAGNOSIS — I214 Non-ST elevation (NSTEMI) myocardial infarction: Secondary | ICD-10-CM | POA: Diagnosis not present

## 2019-09-12 DIAGNOSIS — I10 Essential (primary) hypertension: Secondary | ICD-10-CM | POA: Diagnosis not present

## 2019-09-12 DIAGNOSIS — I2510 Atherosclerotic heart disease of native coronary artery without angina pectoris: Secondary | ICD-10-CM | POA: Diagnosis not present

## 2019-09-12 LAB — BASIC METABOLIC PANEL
BUN/Creatinine Ratio: 12 (ref 9–20)
BUN: 13 mg/dL (ref 6–24)
CO2: 23 mmol/L (ref 20–29)
Calcium: 9.8 mg/dL (ref 8.7–10.2)
Chloride: 105 mmol/L (ref 96–106)
Creatinine, Ser: 1.09 mg/dL (ref 0.76–1.27)
GFR calc Af Amer: 87 mL/min/{1.73_m2} (ref >59)
GFR calc non Af Amer: 75 mL/min/{1.73_m2} (ref >59)
Glucose: 139 mg/dL — ABNORMAL HIGH (ref 65–99)
Potassium: 4.7 mmol/L (ref 3.5–5.2)
Sodium: 141 mmol/L (ref 134–144)

## 2019-09-12 LAB — LIPID PANEL
Chol/HDL Ratio: 2.6 ratio (ref 0.0–5.0)
Cholesterol, Total: 78 mg/dL — ABNORMAL LOW (ref 100–199)
HDL: 30 mg/dL — ABNORMAL LOW (ref >39)
LDL Chol Calc (NIH): 20 mg/dL (ref 0–99)
Triglycerides: 167 mg/dL — ABNORMAL HIGH (ref 0–149)
VLDL Cholesterol Cal: 28 mg/dL (ref 5–40)

## 2019-09-12 LAB — HEPATIC FUNCTION PANEL
ALT: 41 IU/L (ref 0–44)
AST: 30 IU/L (ref 0–40)
Albumin: 4.7 g/dL (ref 3.8–4.9)
Alkaline Phosphatase: 61 IU/L (ref 48–121)
Bilirubin Total: 0.5 mg/dL (ref 0.0–1.2)
Bilirubin, Direct: 0.2 mg/dL (ref 0.00–0.40)
Total Protein: 7.3 g/dL (ref 6.0–8.5)

## 2019-09-12 NOTE — Patient Instructions (Signed)
Medication Instructions:  Your provider recommends that you continue on your current medications as directed. Please refer to the Current Medication list given to you today.   *If you need a refill on your cardiac medications before your next appointment, please call your pharmacy*  Lab Work: TODAY! Lipids, liver, BMET If you have labs (blood work) drawn today and your tests are completely normal, you will receive your results only by:  MyChart Message (if you have MyChart) OR  A paper copy in the mail If you have any lab test that is abnormal or we need to change your treatment, we will call you to review the results.  Testing/Procedures: Your physician has requested that you have a carotid duplex at [facility]. This test is an ultrasound of the carotid arteries in your neck. It looks at blood flow through these arteries that supply the brain with blood. Allow one hour for this exam. There are no restrictions or special instructions.  Follow-Up: At [facility] you and your health needs are our priority.  As part of our continuing mission to provide you with exceptional heart care, we have created designated Provider Care Teams.  These Care Teams include your primary Cardiologist (physician) and Advanced Practice Providers (APPs -  Physician Assistants and Nurse Practitioners) who all work together to provide you with the care you need, when you need it. Your next appointment:   12 month(s) The format for your next appointment:   In Person Provider:   You may see Kristeen Miss, MD or one of the following Advanced Practice Providers on your designated Care Team:    Tereso Newcomer, PA-C  [place], [place]

## 2019-09-12 NOTE — Progress Notes (Signed)
Cardiology Office Note   Date:  09/12/2019   ID:  Logan Reynolds, DOB October 04, 1965, MRN [id number]  PCP:  Kirstie Peri, MD  Cardiologist:  Dr. Elease Hashimoto   Problem List 1. CAD - s/p emergent CABG  01-07-15 2. DM 3. Post op atrial fib 4.   Post-hospital follow up- emergent CABG    Logan Reynolds is a 54 y.o. male with a history of HTN, tobacco abuse, DMT2 and CAD s/p recent emergent CABG who presents to clinic for post hospital follow up.   He presented to [facility] with chest pain in 12/2014. He ruled in for NSTEMI and was transferred to [facility] for further evalation and treatment. He underwent cath 01/07/15 which showed 90% distal LM, 100% proximal to mid LAD with 99% lesion in two diagonals, large OM system without disease and RCA without disease as well as mild LV dysfunction. Due to these findings emergent cardiothoracic surgical consultation was obtained with Evelene Croon M.D. who evaluated the patient and his studies and agreed with recommendations to proceed with emergent coronary artery surgical revascularization. He underwent CABG x4V (LIMA--> LAD, SVG--> D1, SVG--> D2, SVG--> OM) on 01/07/15. His post-op course was complicated by aflutter with RVR. He was given IV Lopressor and he converted to sinus rhythm. He was then put on oral Amiodarone. He has remained in sinus rhythm and is surgically stable for discharge 01/12/15.  April 30, 2015: Pt was seen by Carlean Jews several weeks ago .  Still has some fatigue . 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 20, 2017:  Logan Reynolds is recovering well. Still has chest wall tenderness.  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   Jun 02, 2016:  Logan Reynolds is seen for follow up of his CAD Diabetes is not well controlled yet Logan Reynolds was started.   No CP or dyspnea.    Dec. 7,  2018:  Logan Reynolds is doing well.  He it has been 2 years since his bypass surgery.  He stopped smoking at that time.  He still working on getting his glucose levels under control. He is very active at his work.  He works as an Personnel officer.  He does not get much aerobic exercise.  He admits that he has a treadmill at home that he does not use.  July 27, 2017:  Logan Reynolds is doing well.  Seen with wife, [name] .    Working hard without limitations -  Having some left chest skin numbness.   Was on Lipitor - caused some liver enz elevation Tolerating the crestor 40  - liver enz look great.  Has leg cramping when he is working out in the heat .    Sept. 11, 2020   Logan Reynolds is seen for follow up of CAD / CABG.   Has  Hyperlipidemia  Has had a cholecystectomy since I last saw him ( Dec. 2019)   Was also found to have an occluded distal aorta - has seen Dr. Chestine Spore.   The plan is for conservative observation and to return to see Dr. Chestine Spore if he develops symptoms of claudicatino. No angina .   Labs from Jan. 2020 look great .  Glucose is still elevated. - 200   September 12, 2019: Logan Reynolds is seen today for follow-up visit.  He has a  history of coronary artery disease and significant peripheral vascular disease.  He has an occluded distal aorta and is followed by Dr. Chestine Spore.  He has a history of coronary artery bypass grafting.  No angina  Has stable claudication  - has talked to Dr. Chestine Spore Still has sub-optimal glucose control     Wt Readings from Last 3 Encounters:  09/12/19 199 lb 3.2 oz (90.4 kg)  09/20/18 204 lb 12.8 oz (92.9 kg)  01/22/18 198 lb (89.8 kg)      Past Medical History:  Diagnosis Date  . Anxiety   . Chronic cholecystitis 12/31/2017  . Diabetes mellitus, type 2 (HCC)   . GERD (gastroesophageal reflux disease)   . HTN (hypertension)   . Myocardial infarction (HCC)    2016  . Tobacco abuse     Past Surgical History:  Procedure Laterality Date  . CARDIAC CATHETERIZATION  2006   . CARDIAC CATHETERIZATION N/A 01/07/2015   Procedure: Left Heart Cath and Coronary Angiography;  Surgeon: Marykay Lex, MD;  Location: [facility] INVASIVE CV LAB;  Service: Cardiovascular;  Laterality: N/A;  . CHOLECYSTECTOMY N/A 12/31/2017   Procedure: LAPAROSCOPIC CHOLECYSTECTOMY WITH INTRAOPERATIVE CHOLANGIOGRAM;  Surgeon: Claud Kelp, MD;  Location: [facility] OR;  Service: General;  Laterality: N/A;  . CORONARY ARTERY BYPASS GRAFT N/A 01/07/2015   Procedure: CORONARY ARTERY BYPASS GRAFTING (CABG)x 4 using left internal mammary artery and right saphenous leg vein.;  Surgeon: Alleen Borne, MD;  Location: [facility] OR;  Service: Open Heart Surgery;  Laterality: N/A;  . KNEE ARTHROSCOPY       Current Outpatient Medications  Medication Sig Dispense Refill  . acetaminophen (TYLENOL) 500 MG tablet Take 500-1,000 mg by mouth every 6 (six) hours as needed (PAIN.).    Marland Kitchen acyclovir ointment (ZOVIRAX) 5 % Apply 1 application topically 3 (three) times daily as needed.    . ALPRAZolam (XANAX) 0.5 MG tablet Take 0.5 mg by mouth 3 (three) times daily as needed for anxiety.    Marland Kitchen aspirin 81 MG EC tablet Take 1 tablet (81 mg total) by mouth daily.    Marland Kitchen JARDIANCE 25 MG TABS tablet Take 25 mg by mouth daily.  2  . lisinopril (PRINIVIL,ZESTRIL) 2.5 MG tablet Take 2.5 mg by mouth daily.    . meloxicam (MOBIC) 15 MG tablet Take 15 mg by mouth daily.  1  . metFORMIN (GLUCOPHAGE) 500 MG tablet Take 1,000 mg by mouth 2 (two) times daily with a meal.    . metoprolol tartrate (LOPRESSOR) 25 MG tablet Take 1 tablet (25 mg total) by mouth 2 (two) times daily. 60 tablet 1  . omeprazole (PRILOSEC) 40 MG capsule Take 40 mg by mouth every evening.   1  . OZEMPIC, 1 MG/DOSE, 2 MG/1.5ML SOPN Inject 1 mg into the skin once a week.    . rosuvastatin (CRESTOR) 40 MG tablet TAKE 1 TABLET BY MOUTH EVERY DAY 90 tablet 2   No current facility-administered medications for this visit.    Allergies:   Patient has no known allergies.     Social History:  The patient  reports that he has quit smoking. His smoking use included cigarettes. He has a 37.50 pack-year smoking history. He has never used smokeless tobacco. He reports that he does not drink alcohol and does not use drugs.   Family History:  The patient's family history includes Cancer in his father and mother; Diabetes in his brother and maternal grandmother; Heart attack in his father; Heart disease in his  father.    ROS:  Please see the history of present illness.   Otherwise, review of systems are positive for NONE.   All other systems are reviewed and negative.   Physical Exam: Blood pressure 106/66, pulse 67, height 6' (1.829 m), weight 199 lb 3.2 oz (90.4 kg), SpO2 97 %.  GEN:  Well nourished, well developed in no acute distress HEENT: Normal NECK: No JVD; No carotid bruits LYMPHATICS: No lymphadenopathy CARDIAC: RRR , no murmurs, rubs, gallops RESPIRATORY:  Clear to auscultation without rales, wheezing or rhonchi  ABDOMEN: Soft, non-tender, non-distended MUSCULOSKELETAL:  No edema; No deformity ,  Reduced foot pulses  SKIN: Warm and dry NEUROLOGIC:  Alert and oriented x 3   EKG: September 12, 2019: Normal sinus rhythm at 67.  Nonspecific ST and T wave abnormalities.   Recent Labs: 09/20/2018: ALT 44; BUN 13; Creatinine, Ser 1.10; Potassium 4.9; Sodium 138    Lipid Panel    Component Value Date/Time   CHOL 84 (L) 09/20/2018 1000   TRIG 104 09/20/2018 1000   HDL 34 (L) 09/20/2018 1000   CHOLHDL 2.5 09/20/2018 1000   CHOLHDL 1.9 10/29/2015 1129   VLDL 22 10/29/2015 1129   LDLCALC 30 09/20/2018 1000      Wt Readings from Last 3 Encounters:  09/12/19 199 lb 3.2 oz (90.4 kg)  09/20/18 204 lb 12.8 oz (92.9 kg)  01/22/18 198 lb (89.8 kg)      Other studies Reviewed: Additional studies/ records that were reviewed today include: [facility] Review of the above records demonstrates: see HPI   ASSESSMENT AND PLAN:  Logan Reynolds is a 54 y.o.  y.o. male with a history of HTN, tobacco abuse, DMT2 and CAD s/p recent emergent CABG who presents to clinic for post hospital follow up.   CAD/NSTEMI s/p recent emergent CABG x4V  Trayvion denies any angina .   Feeling well     2.  PAD :   Has an occluded distal aorta.  Also has a right carotid bruit.    Will get a duplex scan at the [facility] office. Will have Dr. Chestine Spore continue to follow his carotid disease as well as his occluded aorta    Hyperlipidemia: - cont rosuvastatin .   Check labs today    HTN: - Bp is well controlled    Tobacco abuse:- hs stopped smoking        Signed, Kristeen Miss, MD  09/12/2019 9:58 AM    [facility] [address] [place], [place], [place]  [address] Phone: [phone]; Fax: [phone]

## 2019-09-19 ENCOUNTER — Telehealth: Payer: Self-pay

## 2019-09-19 ENCOUNTER — Other Ambulatory Visit: Payer: Self-pay

## 2019-09-19 ENCOUNTER — Ambulatory Visit (HOSPITAL_COMMUNITY)
Admission: RE | Admit: 2019-09-19 | Discharge: 2019-09-19 | Disposition: A | Discharge status: Home / Self Care | Payer: BC Managed Care – PPO | Source: Ambulatory Visit | Attending: Cardiovascular Disease | Admitting: Cardiovascular Disease

## 2019-09-19 DIAGNOSIS — I779 Disorder of arteries and arterioles, unspecified: Secondary | ICD-10-CM

## 2019-09-19 DIAGNOSIS — E785 Hyperlipidemia, unspecified: Secondary | ICD-10-CM

## 2019-09-19 DIAGNOSIS — I2510 Atherosclerotic heart disease of native coronary artery without angina pectoris: Secondary | ICD-10-CM | POA: Diagnosis not present

## 2019-09-19 DIAGNOSIS — I739 Peripheral vascular disease, unspecified: Secondary | ICD-10-CM | POA: Diagnosis present

## 2019-09-19 NOTE — Telephone Encounter (Signed)
-----   Message from Vesta Mixer, MD sent at 09/19/2019  4:04 PM EDT ----- Mild bilat carotid artery disease Repeat in 1 year

## 2019-09-19 NOTE — Telephone Encounter (Signed)
The patient has been notified of the result and verbalized understanding.  All questions (if any) were answered. Theresia Majors, RN 09/19/2019 4:55 PM  Orders placed for repeat carotids.

## 2019-10-03 ENCOUNTER — Encounter (HOSPITAL_COMMUNITY): Payer: BC Managed Care – PPO

## 2020-09-10 IMAGING — NM NM HEPATO W/GB/PHARM/[PERSON_NAME]
3 series · 13 of 13 positions shown · non-contrast
Comparison: Ultrasound right upper quadrant December 10, 2017

CLINICAL DATA: Upper abdominal pain.  Cholelithiasis.

EXAM:
NUCLEAR MEDICINE HEPATOBILIARY IMAGING WITH GALLBLADDER EF
VIEWS:
Anterior right upper quadrant
RADIOPHARMACEUTICALS:  5.1 mCi 7c-ZZm  Choletec IV

[he hepatobiliary · 2.26mm/px · 1 of 1 slices shown (1 of 3)]
[im 1/1]
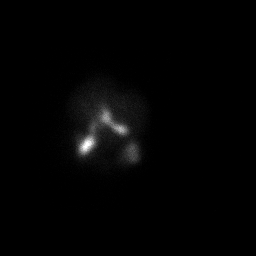

[he hepatobiliary · 4.52mm/px · 6 of 60 frames shown (2 of 3)]
[frame 6/60]
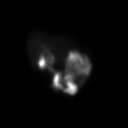
[frame 16/60]
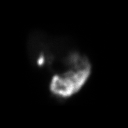
[frame 26/60]
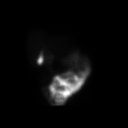
[frame 36/60]
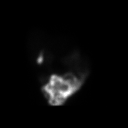
[frame 46/60]
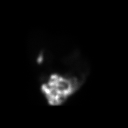
[frame 56/60]
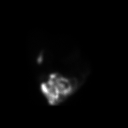

[he hepatobiliary · 4.52mm/px · 6 of 60 frames shown (3 of 3)]
[frame 6/60]
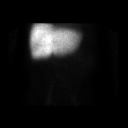
[frame 16/60]
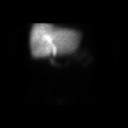
[frame 26/60]
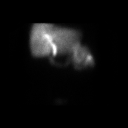
[frame 36/60]
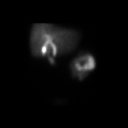
[frame 46/60]
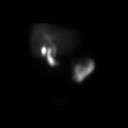
[frame 56/60]
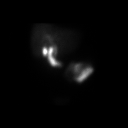

[13 of 13 positions shown; findings below may reference images not displayed]

FINDINGS: Liver uptake of radiotracer is unremarkable. There is visualization
of gallbladder and small bowel, indicating patency of the cystic and
common bile ducts.

Patient consumed 8 ounces of Ensure Enlive orally with calculation
of the computer generated ejection fraction of radiotracer from the
gallbladder. The patient did not experience clinical symptoms with
the oral Ensure consumption. The computer generated ejection
fraction of radiotracer from the gallbladder is 56%, normal greater
than 33% using the oral agent.
IMPRESSION: Normal ejection fraction of radiotracer from the gallbladder.
Patient did not experience clinical symptoms with the oral Ensure
consumption. Cystic and common bile ducts are patent as is evidenced
by visualization of gallbladder and small bowel.

## 2020-09-14 ENCOUNTER — Other Ambulatory Visit (HOSPITAL_COMMUNITY): Payer: Self-pay | Admitting: Cardiovascular Disease

## 2020-09-14 DIAGNOSIS — I6523 Occlusion and stenosis of bilateral carotid arteries: Secondary | ICD-10-CM

## 2020-09-24 ENCOUNTER — Ambulatory Visit (HOSPITAL_COMMUNITY)
Admission: RE | Admit: 2020-09-24 | Discharge: 2020-09-24 | Disposition: A | Discharge status: Home / Self Care | Payer: BC Managed Care – PPO | Source: Ambulatory Visit | Attending: Internal Medicine | Admitting: Internal Medicine

## 2020-09-24 ENCOUNTER — Other Ambulatory Visit: Payer: Self-pay

## 2020-09-24 DIAGNOSIS — I6523 Occlusion and stenosis of bilateral carotid arteries: Secondary | ICD-10-CM | POA: Diagnosis present

## 2020-10-01 ENCOUNTER — Encounter: Payer: Self-pay | Admitting: Cardiovascular Disease

## 2020-10-01 ENCOUNTER — Other Ambulatory Visit: Payer: Self-pay

## 2020-10-01 ENCOUNTER — Ambulatory Visit: Payer: BC Managed Care – PPO | Admitting: Cardiovascular Disease

## 2020-10-01 VITALS — BP 128/64 | HR 77 | Ht 72.0 in | Wt 204.0 lb

## 2020-10-01 DIAGNOSIS — I10 Essential (primary) hypertension: Secondary | ICD-10-CM

## 2020-10-01 DIAGNOSIS — I2510 Atherosclerotic heart disease of native coronary artery without angina pectoris: Secondary | ICD-10-CM

## 2020-10-01 DIAGNOSIS — I739 Peripheral vascular disease, unspecified: Secondary | ICD-10-CM | POA: Diagnosis not present

## 2020-10-01 DIAGNOSIS — E785 Hyperlipidemia, unspecified: Secondary | ICD-10-CM

## 2020-10-01 DIAGNOSIS — I6523 Occlusion and stenosis of bilateral carotid arteries: Secondary | ICD-10-CM

## 2020-10-01 LAB — HEPATIC FUNCTION PANEL
ALT: 50 IU/L — ABNORMAL HIGH (ref 0–44)
AST: 39 IU/L (ref 0–40)
Albumin: 4.9 g/dL (ref 3.8–4.9)
Alkaline Phosphatase: 59 IU/L (ref 44–121)
Bilirubin Total: 0.4 mg/dL (ref 0.0–1.2)
Bilirubin, Direct: 0.18 mg/dL (ref 0.00–0.40)
Total Protein: 7.4 g/dL (ref 6.0–8.5)

## 2020-10-01 LAB — BASIC METABOLIC PANEL
BUN/Creatinine Ratio: 16 (ref 9–20)
BUN: 18 mg/dL (ref 6–24)
CO2: 18 mmol/L — ABNORMAL LOW (ref 20–29)
Calcium: 9.5 mg/dL (ref 8.7–10.2)
Chloride: 100 mmol/L (ref 96–106)
Creatinine, Ser: 1.14 mg/dL (ref 0.76–1.27)
Glucose: 184 mg/dL — ABNORMAL HIGH (ref 65–99)
Potassium: 4.9 mmol/L (ref 3.5–5.2)
Sodium: 135 mmol/L (ref 134–144)
eGFR: 75 mL/min/{1.73_m2} (ref >59)

## 2020-10-01 LAB — LIPID PANEL
Chol/HDL Ratio: 3.3 ratio (ref 0.0–5.0)
Cholesterol, Total: 93 mg/dL — ABNORMAL LOW (ref 100–199)
HDL: 28 mg/dL — ABNORMAL LOW (ref >39)
LDL Chol Calc (NIH): 38 mg/dL (ref 0–99)
Triglycerides: 159 mg/dL — ABNORMAL HIGH (ref 0–149)
VLDL Cholesterol Cal: 27 mg/dL (ref 5–40)

## 2020-10-01 NOTE — Progress Notes (Signed)
Cardiology Office Note   Date:  10/01/2020   ID:  Logan Reynolds, DOB 05/03/1965, MRN [id number]  PCP:  Logan Peri, MD  Cardiologist:  Dr. Elease Reynolds   Problem List 1. CAD - s/p emergent CABG  01-07-15 2. DM 3. Post op atrial fib 4.   Post-hospital follow up- emergent CABG    Logan Reynolds is a 55 y.o. male with a history of HTN, tobacco abuse, DMT2 and CAD s/p recent emergent CABG who presents to clinic for post hospital follow up.   He presented to [facility] with chest pain in 12/2014. He ruled in for NSTEMI and was transferred to [facility] for further evalation and treatment. He underwent cath 01/07/15 which showed 90% distal LM, 100% proximal to mid LAD with 99% lesion in two diagonals, large OM system without disease and RCA without disease as well as mild LV dysfunction. Due to these findings emergent cardiothoracic surgical consultation was obtained with Logan Reynolds M.D. who evaluated the patient and his studies and agreed with recommendations to proceed with emergent coronary artery surgical revascularization. He underwent CABG x4V (LIMA--> LAD, SVG--> D1, SVG--> D2, SVG--> OM) on 01/07/15. His post-op course was complicated by aflutter with RVR. He was given IV Lopressor and he converted to sinus rhythm. He was then put on oral Amiodarone. He has remained in sinus rhythm and is surgically stable for discharge 01/12/15.  April 30, 2015: Pt was seen by Logan Reynolds several weeks ago .  Still has some fatigue . 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 20, 2017:  Logan Reynolds is recovering well. Still has chest wall tenderness.  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   Jun 02, 2016:  Logan Reynolds is seen for follow up of his CAD Diabetes is not well controlled yet Logan Reynolds was started.   No CP or dyspnea.    Dec. 7,  2018:  Logan Reynolds is doing well.  He it has been 2 years since his bypass surgery.  He stopped smoking at that time.  He still working on getting his glucose levels under control. He is very active at his work.  He works as an Personnel officer.  He does not get much aerobic exercise.  He admits that he has a treadmill at home that he does not use.  July 27, 2017:  Logan Reynolds is doing well.  Seen with wife, Logan Reynolds .    Working hard without limitations -  Having some left chest skin numbness.   Was on Lipitor - caused some liver enz elevation Tolerating the crestor 40  - liver enz look great.  Has leg cramping when he is working out in the heat .    Sept. 11, 2020   Logan Reynolds is seen for follow up of CAD / CABG.   Has  Hyperlipidemia  Has had a cholecystectomy since I last saw him ( Dec. 2019)   Was also found to have an occluded distal aorta - has seen Dr. Chestine Reynolds.   The plan is for conservative observation and to return to see Dr. Chestine Reynolds if he develops symptoms of claudicatino. No angina .   Labs from Jan. 2020 look great .  Glucose is still elevated. - 200   September 12, 2019: Logan Reynolds is seen today for follow-up visit.  He has a  history of coronary artery disease and significant peripheral vascular disease.  He has an occluded distal aorta and is followed by Dr. Chestine Reynolds.  He has a history of coronary artery bypass grafting.  No angina  Has stable claudication  - has talked to Dr. Chestine Reynolds Still has sub-optimal glucose control    Sept, 23, 2022:  Logan Reynolds is seen today .  He has a history of coronary artery disease and significant peripheral vascular disease.  He has an occluded distal aorta and is followed by Dr. Chestine Reynolds. Has collateral circulation to his legs  Has significant claudication   Wt Readings from Last 3 Encounters:  10/01/20 204 lb (92.5 kg)  09/12/19 199 lb 3.2 oz (90.4 kg)  09/20/18 204 lb 12.8 oz (92.9 kg)      Past Medical History:  Diagnosis Date   Anxiety    Chronic cholecystitis  12/31/2017   Diabetes mellitus, type 2 (HCC)    GERD (gastroesophageal reflux disease)    HTN (hypertension)    Myocardial infarction (HCC)    2016   Tobacco abuse     Past Surgical History:  Procedure Laterality Date   CARDIAC CATHETERIZATION  2006   CARDIAC CATHETERIZATION N/A 01/07/2015   Procedure: Left Heart Cath and Coronary Angiography;  Surgeon: Logan Lex, MD;  Location: [facility] INVASIVE CV LAB;  Service: Cardiovascular;  Laterality: N/A;   CHOLECYSTECTOMY N/A 12/31/2017   Procedure: LAPAROSCOPIC CHOLECYSTECTOMY WITH INTRAOPERATIVE CHOLANGIOGRAM;  Surgeon: Logan Kelp, MD;  Location: [facility] OR;  Service: General;  Laterality: N/A;   CORONARY ARTERY BYPASS GRAFT N/A 01/07/2015   Procedure: CORONARY ARTERY BYPASS GRAFTING (CABG)x 4 using left internal mammary artery and right saphenous leg vein.;  Surgeon: Logan Borne, MD;  Location: [facility] OR;  Service: Open Heart Surgery;  Laterality: N/A;   KNEE ARTHROSCOPY       Current Outpatient Medications  Medication Sig Dispense Refill   acetaminophen (TYLENOL) 500 MG tablet Take 500-1,000 mg by mouth every 6 (six) hours as needed (PAIN.).     acyclovir ointment (ZOVIRAX) 5 % Apply 1 application topically 3 (three) times daily as needed.     ALPRAZolam (XANAX) 0.5 MG tablet Take 0.5 mg by mouth 3 (three) times daily as needed for anxiety.     aspirin 81 MG EC tablet Take 1 tablet (81 mg total) by mouth daily.     glimepiride (AMARYL) 4 MG tablet Take 4 mg by mouth daily.     JANUVIA 100 MG tablet Take 100 mg by mouth daily.     JARDIANCE 25 MG TABS tablet Take 25 mg by mouth daily.  2   lisinopril (PRINIVIL,ZESTRIL) 2.5 MG tablet Take 2.5 mg by mouth daily.     meloxicam (MOBIC) 15 MG tablet Take 15 mg by mouth daily.  1   metFORMIN (GLUCOPHAGE) 500 MG tablet Take 1,000 mg by mouth 2 (two) times daily with a meal.     metoprolol tartrate (LOPRESSOR) 25 MG tablet Take 1 tablet (25 mg total) by mouth 2 (two) times daily. 60 tablet 1    omeprazole (PRILOSEC) 40 MG capsule Take 40 mg by mouth every evening.   1   OZEMPIC, 1 MG/DOSE, 2 MG/1.5ML SOPN Inject 1 mg into the skin once a week.     rosuvastatin (CRESTOR) 40 MG tablet TAKE 1 TABLET BY MOUTH EVERY DAY 90 tablet 2   No current facility-administered medications for this visit.    Allergies:   Patient has no known allergies.  Social History:  The patient  reports that he has quit smoking. His smoking use included cigarettes. He has a 37.50 pack-year smoking history. He has never used smokeless tobacco. He reports that he does not drink alcohol and does not use drugs.   Family History:  The patient's family history includes Cancer in his father and mother; Diabetes in his brother and maternal grandmother; Heart attack in his father; Heart disease in his father.    ROS:  Please see the history of present illness.   Otherwise, review of systems are positive for NONE.   All other systems are reviewed and negative.   Physical Exam: Blood pressure 128/64, pulse 77, height 6' (1.829 m), weight 204 lb (92.5 kg), SpO2 95 %.  GEN:  Well nourished, well developed in no acute distress HEENT: Normal NECK: No JVD; No carotid bruits LYMPHATICS: No lymphadenopathy CARDIAC: RRR , no murmurs, rubs, gallops RESPIRATORY:  Clear to auscultation without rales, wheezing or rhonchi  ABDOMEN: Soft, non-tender, non-distended MUSCULOSKELETAL:  No edema; No deformity  SKIN: Warm and dry NEUROLOGIC:  Alert and oriented x 3    EKG: Sept. 23, 2022:  NSR at 77, no ST or T wave changes.    Recent Labs: No results found for requested labs within last 8760 hours.    Lipid Panel    Component Value Date/Time   CHOL 78 (L) 09/12/2019 1027   TRIG 167 (H) 09/12/2019 1027   HDL 30 (L) 09/12/2019 1027   CHOLHDL 2.6 09/12/2019 1027   CHOLHDL 1.9 10/29/2015 1129   VLDL 22 10/29/2015 1129   LDLCALC 20 09/12/2019 1027      Wt Readings from Last 3 Encounters:  10/01/20 204 lb (92.5 kg)   09/12/19 199 lb 3.2 oz (90.4 kg)  09/20/18 204 lb 12.8 oz (92.9 kg)      Other studies Reviewed: Additional studies/ records that were reviewed today include: LHC Review of the above records demonstrates: see HPI   ASSESSMENT AND PLAN:  Logan Reynolds is a 55 y.o. y.o. male with a history of HTN, tobacco abuse, DMT2 and CAD s/p recent emergent CABG who presents to clinic for post hospital follow up.    CAD/NSTEMI s/p recent emergent CABG x4V   2.  PAD :    has an occluded distal aorta  Has mild carotid artery disease on the left carotid   Hyperlipidemia: - cont rosuvastatin .      HTN: - Bp is well controlled    Tobacco abuse:- hs stopped smoking        Signed, Kristeen Miss, MD  10/01/2020 10:35 AM    [facility] [address] [place], [place], [place]  [address] Phone: [phone]; Fax: [phone]

## 2020-10-01 NOTE — Patient Instructions (Addendum)
Medication Instructions:  Your physician recommends that you continue on your current medications as directed. Please refer to the Current Medication list given to you today.  *If you need a refill on your cardiac medications before your next appointment, please call your pharmacy*   Lab Work: TODAY - lipid panel, liver panel, basic metabolic panel If you have labs (blood work) drawn today and your tests are completely normal, you will receive your results only by: MyChart Message (if you have MyChart) OR A paper copy in the mail If you have any lab test that is abnormal or we need to change your treatment, we will call you to review the results.   Testing/Procedures: None Ordered   Follow-Up: At [facility], you and your health needs are our priority.  As part of our continuing mission to provide you with exceptional heart care, we have created designated Provider Care Teams.  These Care Teams include your primary Cardiologist (physician) and Advanced Practice Providers (APPs -  Physician Assistants and Nurse Practitioners) who all work together to provide you with the care you need, when you need it.   Your next appointment:   1 year(s)  The format for your next appointment:   In Person  Provider:   You may see Philip Nahser, MD or one of the following Advanced Practice Providers on your designated Care Team:   Scott Weaver, PA-C Vin Bhagat, PA-C    

## 2021-10-20 ENCOUNTER — Encounter: Payer: Self-pay | Admitting: Cardiovascular Disease

## 2021-10-20 NOTE — Progress Notes (Signed)
Cardiology Office Note   Date:  10/21/2021   ID:  Logan Reynolds, DOB 1965/06/12, MRN [id number]  PCP:  Monico Blitz, MD  Cardiologist:  Dr. Acie Fredrickson   Problem List 1. CAD - s/p emergent CABG  01-07-15 2. DM 3. Post op atrial fib 4.   Post-hospital follow up- emergent CABG    Logan Reynolds is a 56 y.o. male with a history of HTN, tobacco abuse, DMT2 and CAD s/p recent emergent CABG who presents to clinic for post hospital follow up.   He presented to [facility] with chest pain in 12/2014. He ruled in for NSTEMI and was transferred to [facility] for further evalation and treatment. He underwent cath 01/07/15 which showed 90% distal LM, 100% proximal to mid LAD with 99% lesion in two diagonals, large OM system without disease and RCA without disease as well as mild LV dysfunction. Due to these findings emergent cardiothoracic surgical consultation was obtained with Gilford Raid M.D. who evaluated the patient and his studies and agreed with recommendations to proceed with emergent coronary artery surgical revascularization. He underwent CABG x4V (LIMA--> LAD, SVG--> D1, SVG--> D2, SVG--> OM) on 01/07/15. His post-op course was complicated by aflutter with RVR. He was given IV Lopressor and he converted to sinus rhythm. He was then put on oral Amiodarone. He has remained in sinus rhythm and is surgically stable for discharge 01/12/15.  April 30, 2015: Pt was seen by [name] several weeks ago .  Still has some fatigue . 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 20, 2017:  Logan Reynolds is recovering well. Still has chest wall tenderness.  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   Jun 02, 2016:  Logan Reynolds is seen for follow up of his CAD Diabetes is not well controlled yet [name] was started.   No CP or dyspnea.    Dec. 7,  2018:  [name] is doing well.  He it has been 2 years since his bypass surgery.  He stopped smoking at that time.  He still working on getting his glucose levels under control. He is very active at his work.  He works as an Clinical biochemist.  He does not get much aerobic exercise.  He admits that he has a treadmill at home that he does not use.  July 27, 2017:  Logan Reynolds is doing well.  Seen with wife, [name] .    Working hard without limitations -  Having some left chest skin numbness.   Was on Lipitor - caused some liver enz elevation Tolerating the crestor 40  - liver enz look great.  Has leg cramping when he is working out in the heat .    Sept. 11, 2020   [name] is seen for follow up of CAD / CABG.   Has  Hyperlipidemia  Has had a cholecystectomy since I last saw him ( Dec. 2019)   Was also found to have an occluded distal aorta - has seen Dr. Carlis Abbott.   The plan is for conservative observation and to return to see Dr. Carlis Abbott if he develops symptoms of claudicatino. No angina .   Labs from Jan. 2020 look great .  Glucose is still elevated. - 200   September 12, 2019: Logan Reynolds is seen today for follow-up visit.  He has a  history of coronary artery disease and significant peripheral vascular disease.  He has an occluded distal aorta and is followed by Dr. Chestine Spore.  He has a history of coronary artery bypass grafting.  No angina  Has stable claudication  - has talked to Dr. Chestine Spore Still has sub-optimal glucose control    Sept, 23, 2022:  Logan Reynolds is seen today .  He has a history of coronary artery disease and significant peripheral vascular disease.  He has an occluded distal aorta and is followed by Dr. Chestine Spore. Has collateral circulation to his legs  Has significant claudication  Oct. 13, 2023 Logan Reynolds is seen today for follow up of his CAD , has an occluded distlal aorta  Had urgent CABG in 2016 Needs to see Dr. Chestine Spore  for his occluded aorta  Still having claudication      Wt Readings from  Last 3 Encounters:  10/21/21 204 lb (92.5 kg)  10/01/20 204 lb (92.5 kg)  09/12/19 199 lb 3.2 oz (90.4 kg)      Past Medical History:  Diagnosis Date   Anxiety    Chronic cholecystitis 12/31/2017   Diabetes mellitus, type 2 (HCC)    GERD (gastroesophageal reflux disease)    HTN (hypertension)    Myocardial infarction (HCC)    2016   Tobacco abuse     Past Surgical History:  Procedure Laterality Date   CARDIAC CATHETERIZATION  2006   CARDIAC CATHETERIZATION N/A 01/07/2015   Procedure: Left Heart Cath and Coronary Angiography;  Surgeon: Marykay Lex, MD;  Location: [facility] INVASIVE CV LAB;  Service: Cardiovascular;  Laterality: N/A;   CHOLECYSTECTOMY N/A 12/31/2017   Procedure: LAPAROSCOPIC CHOLECYSTECTOMY WITH INTRAOPERATIVE CHOLANGIOGRAM;  Surgeon: Claud Kelp, MD;  Location: [facility] OR;  Service: General;  Laterality: N/A;   CORONARY ARTERY BYPASS GRAFT N/A 01/07/2015   Procedure: CORONARY ARTERY BYPASS GRAFTING (CABG)x 4 using left internal mammary artery and right saphenous leg vein.;  Surgeon: Alleen Borne, MD;  Location: [facility] OR;  Service: Open Heart Surgery;  Laterality: N/A;   KNEE ARTHROSCOPY       Current Outpatient Medications  Medication Sig Dispense Refill   acetaminophen (TYLENOL) 500 MG tablet Take 500-1,000 mg by mouth every 6 (six) hours as needed (PAIN.).     acyclovir ointment (ZOVIRAX) 5 % Apply 1 application topically 3 (three) times daily as needed.     ALPRAZolam (XANAX) 0.5 MG tablet Take 0.5 mg by mouth 3 (three) times daily as needed for anxiety.     aspirin 81 MG EC tablet Take 1 tablet (81 mg total) by mouth daily.     diclofenac (VOLTAREN) 75 MG EC tablet Take 75 mg by mouth 2 (two) times daily.     glimepiride (AMARYL) 4 MG tablet Take 4 mg by mouth in the morning and at bedtime.     JANUVIA 100 MG tablet Take 100 mg by mouth daily.     lisinopril (PRINIVIL,ZESTRIL) 2.5 MG tablet Take 2.5 mg by mouth daily.     meloxicam (MOBIC) 15 MG tablet Take 15  mg by mouth daily.  1   metFORMIN (GLUCOPHAGE) 500 MG tablet Take 1,000 mg by mouth 2 (two) times daily with a meal.     metoprolol tartrate (LOPRESSOR) 25 MG tablet Take 1 tablet (25 mg total) by mouth 2 (two) times daily. 60 tablet 1   omeprazole (PRILOSEC) 40 MG capsule Take 40 mg by mouth every evening.   1   OZEMPIC, 1 MG/DOSE, 2 MG/1.5ML SOPN  Inject 1 mg into the skin once a week.     rosuvastatin (CRESTOR) 40 MG tablet TAKE 1 TABLET BY MOUTH EVERY DAY 90 tablet 2   No current facility-administered medications for this visit.    Allergies:   Patient has no known allergies.    Social History:  The patient  reports that he has quit smoking. His smoking use included cigarettes. He has a 37.50 pack-year smoking history. He has never used smokeless tobacco. He reports that he does not drink alcohol and does not use drugs.   Family History:  The patient's family history includes Cancer in his father and mother; Diabetes in his brother and maternal grandmother; Heart attack in his father; Heart disease in his father.    ROS:  Please see the history of present illness.   Otherwise, review of systems are positive for NONE.   All other systems are reviewed and negative.   Physical Exam: Blood pressure 120/68, pulse 68, height 6' (1.829 m), weight 204 lb (92.5 kg), SpO2 95 %.       GEN:  Well nourished, well developed in no acute distress HEENT: Normal NECK: No JVD; No carotid bruits LYMPHATICS: No lymphadenopathy CARDIAC: RRR , no murmurs, rubs, gallops RESPIRATORY:  Clear to auscultation without rales, wheezing or rhonchi  ABDOMEN: Soft, non-tender, non-distended MUSCULOSKELETAL:  No edema; No deformity  SKIN: Warm and dry NEUROLOGIC:  Alert and oriented x 3   EKG:  Oct. 13, 2023.  NSR at 68,  NS ST abn.     Recent Labs: No results found for requested labs within last 365 days.    Lipid Panel    Component Value Date/Time   CHOL 93 (L) 10/01/2020 1036   TRIG 159 (H)  10/01/2020 1036   HDL 28 (L) 10/01/2020 1036   CHOLHDL 3.3 10/01/2020 1036   CHOLHDL 1.9 10/29/2015 1129   VLDL 22 10/29/2015 1129   LDLCALC 38 10/01/2020 1036      Wt Readings from Last 3 Encounters:  10/21/21 204 lb (92.5 kg)  10/01/20 204 lb (92.5 kg)  09/12/19 199 lb 3.2 oz (90.4 kg)      Other studies Reviewed: Additional studies/ records that were reviewed today include: LHC Review of the above records demonstrates: see HPI   ASSESSMENT AND PLAN:  Logan Reynolds is a 56 y.o. y.o. male with a history of HTN, tobacco abuse, DMT2 and CAD s/p recent emergent CABG who presents to clinic for post hospital follow up.    CAD/NSTEMI s/p recent emergent CABG x4V:  no angina.  Cont meds   2.  PAD :   has an occluded aorta    Hyperlipidemia: -     check lipids, ALT , BMP    HTN: - BP is stable    Tobacco abuse:-        Signed, Kristeen Miss, MD  10/21/2021 9:53 AM    [facility] [address] [place], [place], [place]  [address] Phone: [phone]; Fax: [phone]

## 2021-10-21 ENCOUNTER — Ambulatory Visit: Payer: BC Managed Care – PPO | Attending: Cardiovascular Disease | Admitting: Cardiovascular Disease

## 2021-10-21 ENCOUNTER — Encounter: Payer: Self-pay | Admitting: Cardiovascular Disease

## 2021-10-21 VITALS — BP 120/68 | HR 68 | Ht 72.0 in | Wt 204.0 lb

## 2021-10-21 DIAGNOSIS — I2510 Atherosclerotic heart disease of native coronary artery without angina pectoris: Secondary | ICD-10-CM

## 2021-10-21 LAB — LIPID PANEL
Chol/HDL Ratio: 2.9 ratio (ref 0.0–5.0)
Cholesterol, Total: 92 mg/dL — ABNORMAL LOW (ref 100–199)
HDL: 32 mg/dL — ABNORMAL LOW (ref >39)
LDL Chol Calc (NIH): 33 mg/dL (ref 0–99)
Triglycerides: 164 mg/dL — ABNORMAL HIGH (ref 0–149)
VLDL Cholesterol Cal: 27 mg/dL (ref 5–40)

## 2021-10-21 LAB — BASIC METABOLIC PANEL
BUN/Creatinine Ratio: 15 (ref 9–20)
BUN: 19 mg/dL (ref 6–24)
CO2: 20 mmol/L (ref 20–29)
Calcium: 9.1 mg/dL (ref 8.7–10.2)
Chloride: 98 mmol/L (ref 96–106)
Creatinine, Ser: 1.23 mg/dL (ref 0.76–1.27)
Glucose: 450 mg/dL — ABNORMAL HIGH (ref 70–99)
Potassium: 4.7 mmol/L (ref 3.5–5.2)
Sodium: 136 mmol/L (ref 134–144)
eGFR: 68 mL/min/{1.73_m2} (ref >59)

## 2021-10-21 LAB — ALT: ALT: 119 IU/L — ABNORMAL HIGH (ref 0–44)

## 2021-10-21 NOTE — Patient Instructions (Signed)
Medication Instructions:  Your physician recommends that you continue on your current medications as directed. Please refer to the Current Medication list given to you today.  *If you need a refill on your cardiac medications before your next appointment, please call your pharmacy*   Lab Work: Lipids, ALT, BMET today If you have labs (blood work) drawn today and your tests are completely normal, you will receive your results only by: MyChart Message (if you have MyChart) OR A paper copy in the mail If you have any lab test that is abnormal or we need to change your treatment, we will call you to review the results.   Testing/Procedures: NONE   Follow-Up: At [facility], you and your health needs are our priority.  As part of our continuing mission to provide you with exceptional heart care, we have created designated Provider Care Teams.  These Care Teams include your primary Cardiologist (physician) and Advanced Practice Providers (APPs -  Physician Assistants and Nurse Practitioners) who all work together to provide you with the care you need, when you need it.  Your next appointment:   1 year(s)  The format for your next appointment:   In Person  Provider:   Philip Nahser, MD       Important Information About Sugar       

## 2021-10-24 ENCOUNTER — Telehealth: Payer: Self-pay

## 2021-10-24 DIAGNOSIS — R7401 Elevation of levels of liver transaminase levels: Secondary | ICD-10-CM

## 2021-10-24 NOTE — Telephone Encounter (Signed)
-----   Message from Thayer Headings, MD sent at 10/23/2021  8:11 AM EDT ----- ALT is significantly higher He has been on Rosuvastatin 40 mg for years I'm not sure if this is the cause of his ALT elevation DC rosuvastatin Refer to lipid clinic for consideration of PCSK9 inhibitor Recheck ALT in 1 month

## 2021-10-24 NOTE — Telephone Encounter (Signed)
Called and spoke with patient about results. Patient verbalized understanding on stopping Rosuvastatin and need to see lipid clinic with ALT re-check in 1 month. Repeat labs scheduled for 11/25/21 and referral placed to lipid clinic.

## 2021-11-25 ENCOUNTER — Ambulatory Visit: Payer: BC Managed Care – PPO | Attending: Cardiovascular Disease

## 2021-11-25 DIAGNOSIS — R7401 Elevation of levels of liver transaminase levels: Secondary | ICD-10-CM

## 2021-11-26 LAB — ALT: ALT: 103 IU/L — ABNORMAL HIGH (ref 0–44)

## 2021-11-29 ENCOUNTER — Telehealth: Payer: Self-pay | Admitting: Cardiovascular Disease

## 2021-11-29 NOTE — Telephone Encounter (Signed)
-----   Message from Vesta Mixer, MD sent at 11/27/2021  7:52 AM EST ----- ALT is significantly elevated at 103 Please have him hold his Rosuvastatin for 3 weeks and recheck ALT  Please have him follow up with our pharmacy lipid clinic in 4 weeks to review options for alternative lipid lowering medications and to review medications for any other   etiologies that would be contributing to his ALT elevation.

## 2021-11-29 NOTE — Telephone Encounter (Signed)
Reached out to patient with results and elevated ALT. Pt states that he has been off of his Rosuvastatin for 1 month since last being told that his liver enzymes were elevated.  Pt had several drugs on his MAR that can cause hepatotoxicity, but when reviewing his meds with him, he confirmed he's not using any of them and hasn't been for at least a month. States he hasn't had alcohol of any sort since 2019. He understands to remain off of Rosuvastatin at this time. Has PharmD lipid clinic appt scheduled in 1 week and understands that if ALT remains elevated, he may need to see PCP for possible ultrasound.

## 2021-12-06 ENCOUNTER — Ambulatory Visit: Payer: BC Managed Care – PPO

## 2022-01-03 ENCOUNTER — Telehealth: Payer: Self-pay | Admitting: Cardiovascular Disease

## 2022-01-03 NOTE — Telephone Encounter (Signed)
Spoke with patient and advised him to come to the scheduled appointment Thurs and have labs repeated at that time.  I told him that to prevent the potential of 2 sticks as the pharmacist may want more labs, we coild draw while he was at appointment.  He understands and will have them repeated thurs.

## 2022-01-03 NOTE — Telephone Encounter (Signed)
Pt would like to come in for lab work Thursday before his appt with pharmacist. Per last lab results, he states he was told to comeback to recheck ALT

## 2022-01-05 ENCOUNTER — Ambulatory Visit: Payer: BC Managed Care – PPO | Attending: Internal Medicine | Admitting: Student

## 2022-01-05 DIAGNOSIS — E785 Hyperlipidemia, unspecified: Secondary | ICD-10-CM

## 2022-01-05 DIAGNOSIS — E7849 Other hyperlipidemia: Secondary | ICD-10-CM

## 2022-01-05 NOTE — Assessment & Plan Note (Addendum)
Assessment:  LDL goal: < 55 mg/dl last LDLc 33 mg/dl (06/43 while on high intensity statins)  Intolerance to high intensity(rosuvastatin 40 mg atorvastatin 80 mg)- elevated ALT  Given abnormal LFT ezetimibe would not be good choice  Discussed next potential options PCSK-9 inhibitors- cost, dosing efficacy, side effects   Plan: Will get updated lipid lab today to get the baseline off of the statins  Repeat LFT has been ordered for today  Will apply for PA for PCSK9i; will inform patient upon approval (prefers MyChart message)

## 2022-01-05 NOTE — Progress Notes (Signed)
Patient ID: Logan Reynolds                 DOB: September 16, 1965                    MRN: [id number]      HPI: Logan Reynolds is a 56 y.o. male patient referred to lipid clinic by Dr.Nahser PMH is significant for . CAD - s/p emergent CABG, DM,hypertension.   Patient has no acute concern today. He was able to tolerate rosuvastatin well without any side effects and LDLc was controlled but  liver enzymes was elevated so statin was d/c. Off of statin since Oct. States he was on other statins in the past and it caused the same problem (elevated the liver enzymes) does not recall the name. Does not drink alcohol. And diet is low carb due to his diabetes. Patient reports there was no issue controlling his BG before his heart attack. After the heart attack it is challenging for him to lower his A1c. Does not do any exercise but fairly active at work (involves lots of walking and climbing)   Diet: eat whatever he wants to eat   Exercise: none Motivated to start doing regular exercise   Family History: brother: diabetes , meternal grandmother- diabetes  Father- cancer (both lungs)  Mother -cancer (male organs)  Social History:  Alcohol: none Smoking: quit after hear attack (Dec 2016) Recreational drug: none   Labs: Lipid Panel     Component Value Date/Time   CHOL 92 (L) 10/21/2021 1011   TRIG 164 (H) 10/21/2021 1011   HDL 32 (L) 10/21/2021 1011   CHOLHDL 2.9 10/21/2021 1011   CHOLHDL 1.9 10/29/2015 1129   VLDL 22 10/29/2015 1129   LDLCALC 33 10/21/2021 1011   LABVLDL 27 10/21/2021 1011    Past Medical History:  Diagnosis Date   Anxiety    Chronic cholecystitis 12/31/2017   Diabetes mellitus, type 2 (HCC)    GERD (gastroesophageal reflux disease)    HTN (hypertension)    Myocardial infarction (HCC)    2016   Tobacco abuse     Current Outpatient Medications on File Prior to Visit  Medication Sig Dispense Refill   acetaminophen (TYLENOL) 500 MG tablet Take 500-1,000 mg by mouth  every 6 (six) hours as needed (PAIN.).     ALPRAZolam (XANAX) 0.5 MG tablet Take 0.5 mg by mouth 3 (three) times daily as needed for anxiety.     aspirin 81 MG EC tablet Take 1 tablet (81 mg total) by mouth daily.     glimepiride (AMARYL) 4 MG tablet Take 4 mg by mouth in the morning and at bedtime.     lisinopril (PRINIVIL,ZESTRIL) 2.5 MG tablet Take 2.5 mg by mouth daily.     metFORMIN (GLUCOPHAGE) 500 MG tablet Take 1,000 mg by mouth 2 (two) times daily with a meal.     metoprolol tartrate (LOPRESSOR) 25 MG tablet Take 1 tablet (25 mg total) by mouth 2 (two) times daily. 60 tablet 1   omeprazole (PRILOSEC) 40 MG capsule Take 40 mg by mouth every evening.   1   No current facility-administered medications on file prior to visit.    No Known Allergies  Problem  Hyperlipidemia   Current Medications: none  Intolerances: rosuvastatin 40 mg atorvastatin 80 mg - elevated ALT  Risk Factors: CAD - s/p emergent CABG, DM,hypertension.  LDL goal: <55 mg/dl     Hyperlipidemia Assessment:  LDL goal: < 55 mg/dl  last LDLc 33 mg/dl (10/2021 while on high intensity statins)  Intolerance to high intensity(rosuvastatin 40 mg atorvastatin 80 mg)- elevated ALT  Given abnormal LFT ezetimibe would not be good choice  Discussed next potential options PCSK-9 inhibitors- cost, dosing efficacy, side effects   Plan: Will get updated lipid lab today to get the baseline off of the statins  Repeat LFT has been ordered for today  Will apply for PA for PCSK9i; will inform patient upon approval (prefers MyChart message)   Thank you,  Cammy Copa, Pharm.D [facility] of [facility] [place] [address] [place], [place] [address]  Phone: [phone]; Fax: [phone]

## 2022-01-06 ENCOUNTER — Telehealth: Payer: Self-pay | Admitting: Pharmacist

## 2022-01-06 LAB — LIPID PANEL
Chol/HDL Ratio: 5.7 ratio — ABNORMAL HIGH (ref 0.0–5.0)
Cholesterol, Total: 153 mg/dL (ref 100–199)
HDL: 27 mg/dL — ABNORMAL LOW (ref >39)
LDL Chol Calc (NIH): 91 mg/dL (ref 0–99)
Triglycerides: 200 mg/dL — ABNORMAL HIGH (ref 0–149)
VLDL Cholesterol Cal: 35 mg/dL (ref 5–40)

## 2022-01-06 LAB — HEPATIC FUNCTION PANEL
ALT: 63 IU/L — ABNORMAL HIGH (ref 0–44)
AST: 44 IU/L — ABNORMAL HIGH (ref 0–40)
Albumin: 4.7 g/dL (ref 3.8–4.9)
Alkaline Phosphatase: 96 IU/L (ref 44–121)
Bilirubin Total: 0.7 mg/dL (ref 0.0–1.2)
Bilirubin, Direct: 0.22 mg/dL (ref 0.00–0.40)
Total Protein: 7.7 g/dL (ref 6.0–8.5)

## 2022-01-06 NOTE — Telephone Encounter (Signed)
Repatha PA  (Key: [id number])  Waiting for determinations

## 2022-01-13 MED ORDER — REPATHA SURECLICK 140 MG/ML ~~LOC~~ SOAJ: 140.0000 mg | SUBCUTANEOUS | 3 refills | Status: DC

## 2022-01-13 NOTE — Addendum Note (Signed)
Addended by: Anda Latina on: 01/13/2022 09:57 AM   Modules accepted: Orders

## 2022-01-13 NOTE — Telephone Encounter (Signed)
Patient calling in looking for update on medication. Please advise

## 2022-01-13 NOTE — Telephone Encounter (Signed)
PA for Repatha has been approved till 01/11/2022  Prescription for 3 months with 3 refills sent to [facility].  Patient has been informed via [place]

## 2022-03-16 ENCOUNTER — Telehealth: Payer: Self-pay | Admitting: Pharmacist

## 2022-03-16 NOTE — Telephone Encounter (Signed)
Patient's wife called, reports Repatha Co-pay card at pharmacy wont work so can he stop taking it.  Advised to switch dose to every 3 weeks and contact pharmacy in couple weeks as this is temporary issue it would resolve.   He got 1 month supply filled today so will be good for now.

## 2022-08-02 ENCOUNTER — Telehealth: Payer: Self-pay

## 2022-08-02 ENCOUNTER — Other Ambulatory Visit (HOSPITAL_COMMUNITY): Payer: Self-pay

## 2022-08-02 NOTE — Telephone Encounter (Signed)
Pharmacy Patient Advocate Encounter   Received notification from  [facility]  that prior authorization/ADDT INFO for REPATHA is required/requested.   Insurance verification completed.   The patient is insured through  [facility]  .   Per test claim: PA submitted to [facility] [employer] via CoverMyMeds Key/confirmation #/EOC BE [id number] Status is pending  P/A HAS BEEN FAXED VIA CMM

## 2022-08-04 NOTE — Telephone Encounter (Signed)
Pharmacy Patient Advocate Encounter  Received notification from  [facility] [employer]  that Prior Authorization for REPATHA has been  APPROVED FROM 7.26.24 TO 08/03/23  Approval letter will be attached in the pts media

## 2022-11-16 ENCOUNTER — Encounter: Payer: Self-pay | Admitting: Cardiovascular Disease

## 2022-11-16 NOTE — Progress Notes (Signed)
Cardiology Office Note   Date:  11/17/2022   ID:  Logan Reynolds, DOB 1965-07-12, MRN [id number]  PCP:  Kirstie Peri, MD  Cardiologist:  Dr. Elease Hashimoto   Problem List 1. CAD - s/p emergent CABG  01-07-15 2. DM 3. Post op atrial fib 4.   Post-hospital follow up- emergent CABG    Logan Reynolds is a 57 y.o. male with a history of HTN, tobacco abuse, DMT2 and CAD s/p recent emergent CABG who presents to clinic for post hospital follow up.   He presented to [facility] with chest pain in 12/2014. He ruled in for NSTEMI and was transferred to [facility] for further evalation and treatment. He underwent cath 01/07/15 which showed 90% distal LM, 100% proximal to mid LAD with 99% lesion in two diagonals, large OM system without disease and RCA without disease as well as mild LV dysfunction. Due to these findings emergent cardiothoracic surgical consultation was obtained with Evelene Croon M.D. who evaluated the patient and his studies and agreed with recommendations to proceed with emergent coronary artery surgical revascularization. He underwent CABG x4V (LIMA--> LAD, SVG--> D1, SVG--> D2, SVG--> OM) on 01/07/15. His post-op course was complicated by aflutter with RVR. He was given IV Lopressor and he converted to sinus rhythm. He was then put on oral Amiodarone. He has remained in sinus rhythm and is surgically stable for discharge 01/12/15.  Logan Reynolds, Logan Reynolds was seen by Carlean Jews several weeks ago .  Still has some fatigue . 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 20, 2017:  Logan Reynolds is recovering well. Still has chest wall tenderness.  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   Jun 02, 2016:  Logan Reynolds is seen for follow up of his CAD Diabetes is not well controlled yet Logan Reynolds was started.   No CP or dyspnea.    Dec. 7,  2018:  Logan Reynolds is doing well.  He it has been 2 years since his bypass surgery.  He stopped smoking at that time.  He still working on getting his glucose levels under control. He is very active at his work.  He works as an Personnel officer.  He does not get much aerobic exercise.  He admits that he has a treadmill at home that he does not use.  July 27, 2017:  Logan Reynolds is doing well.  Seen with wife, Logan Reynolds .    Working hard without limitations -  Having some left chest skin numbness.   Was on Lipitor - caused some liver enz elevation Tolerating the crestor 40  - liver enz look great.  Has leg cramping when he is working out in the heat .    Sept. 11, 2020   Logan Reynolds is seen for follow up of CAD / CABG.   Has  Hyperlipidemia  Has had a cholecystectomy since I last saw him ( Dec. 2019)   Was also found to have an occluded distal aorta - has seen Dr. Chestine Spore.   The plan is for conservative observation and to return to see Dr. Chestine Spore if he develops symptoms of claudicatino. No angina .   Labs from Jan. 2020 look great .  Glucose is still elevated. - 200   September 12, 2019: Logan Reynolds is seen today for follow-up visit.  He has a  history of coronary artery disease and significant peripheral vascular disease.  He has an occluded distal aorta and is followed by Dr. Chestine Spore.  He has a history of coronary artery bypass grafting.  No angina  Has stable claudication  - has talked to Dr. Chestine Spore Still has sub-optimal glucose control    Sept, 23, 2022:  Logan Reynolds is seen today .  He has a history of coronary artery disease and significant peripheral vascular disease.  He has an occluded distal aorta and is followed by Dr. Chestine Spore. Has collateral circulation to his legs  Has significant claudication  Oct. 13, 2023 Logan Reynolds is seen today for follow up of his CAD , has an occluded distlal aorta  Had urgent CABG in 2016 Needs to see Dr. Chestine Spore  for his occluded aorta  Still having claudication      Nov. 8,  2024  Logan Reynolds is seen for follow up of his CAD / CABG Has an occluded aorta  ~ 2018 Has seen vascular surgery twice,  insurance did not cover the visits very well so he has not been back   Has claudication  - has leg pain / weakness  Especially with walking up hill   No syncope or presyncope   Stopped smoking on the night of his MI   Is on repatha  Is on Insulin Glucose levels are elevated  Still eats carbs       Wt Readings from Last 3 Encounters:  11/17/22 198 lb (89.8 kg)  10/21/21 204 lb (92.5 kg)  10/01/20 204 lb (92.5 kg)      Past Medical History:  Diagnosis Date   Anxiety    Chronic cholecystitis 12/31/2017   Diabetes mellitus, type 2 (HCC)    GERD (gastroesophageal reflux disease)    HTN (hypertension)    Myocardial infarction (HCC)    2016   Tobacco abuse     Past Surgical History:  Procedure Laterality Date   CARDIAC CATHETERIZATION  2006   CARDIAC CATHETERIZATION N/A 01/07/2015   Procedure: Left Heart Cath and Coronary Angiography;  Surgeon: Marykay Lex, MD;  Location: [facility] INVASIVE CV LAB;  Service: Cardiovascular;  Laterality: N/A;   CHOLECYSTECTOMY N/A 12/31/2017   Procedure: LAPAROSCOPIC CHOLECYSTECTOMY WITH INTRAOPERATIVE CHOLANGIOGRAM;  Surgeon: Claud Kelp, MD;  Location: [facility] OR;  Service: General;  Laterality: N/A;   CORONARY ARTERY BYPASS GRAFT N/A 01/07/2015   Procedure: CORONARY ARTERY BYPASS GRAFTING (CABG)x 4 using left internal mammary artery and right saphenous leg vein.;  Surgeon: Alleen Borne, MD;  Location: [facility] OR;  Service: Open Heart Surgery;  Laterality: N/A;   KNEE ARTHROSCOPY       Current Outpatient Medications  Medication Sig Dispense Refill   acetaminophen (TYLENOL) 500 MG tablet Take 500-1,000 mg by mouth every 6 (six) hours as needed (PAIN.).     ALPRAZolam (XANAX) 0.5 MG tablet Take 0.5 mg by mouth 3 (three) times daily as needed for anxiety.     aspirin 81 MG EC tablet Take 1 tablet (81 mg total) by mouth daily.      diclofenac (VOLTAREN) 75 MG EC tablet Take 75 mg by mouth 2 (two) times daily.     Evolocumab (REPATHA SURECLICK) 140 MG/ML SOAJ Inject 140 mg into the skin every 14 (fourteen) days. 6 mL 3   lisinopril (PRINIVIL,ZESTRIL) 2.5 MG tablet Take 2.5 mg by mouth daily.     metFORMIN (GLUCOPHAGE) 500 MG tablet Take 1,000 mg by mouth 2 (two) times daily with a meal.  metoprolol tartrate (LOPRESSOR) 25 MG tablet Take 1 tablet (25 mg total) by mouth 2 (two) times daily. 60 tablet 1   omeprazole (PRILOSEC) 40 MG capsule Take 40 mg by mouth every evening.   1   TRESIBA FLEXTOUCH 100 UNIT/ML FlexTouch Pen Inject 38 Units into the skin daily.     No current facility-administered medications for this visit.    Allergies:   Patient has no known allergies.    Social History:  The patient  reports that he has quit smoking. His smoking use included cigarettes. He has a 37.5 pack-year smoking history. He has never used smokeless tobacco. He reports that he does not drink alcohol and does not use drugs.   Family History:  The patient's family history includes Cancer in his father and mother; Diabetes in his brother and maternal grandmother; Heart attack in his father; Heart disease in his father.    ROS:  Please see the history of present illness.   Otherwise, review of systems are positive for NONE.   All other systems are reviewed and negative.     Physical Exam: Blood pressure 98/60, pulse 76, height 6' (1.829 m), weight 198 lb (89.8 kg).       GEN:  Well nourished, well developed in no acute distress HEENT: Normal NECK: No JVD; No carotid bruits LYMPHATICS: No lymphadenopathy CARDIAC: RRR , no murmurs, rubs, gallops RESPIRATORY:  Clear to auscultation without rales, wheezing or rhonchi  ABDOMEN: Soft, non-tender, non-distended MUSCULOSKELETAL:  No edema; No deformity  SKIN: Warm and dry NEUROLOGIC:  Alert and oriented x 3  EKG:  EKG Interpretation Date/Time:  Friday November 17 2022  15:57:51 EST Ventricular Rate:  76 PR Interval:  162 QRS Duration:  80 QT Interval:  380 QTC Calculation: 427 R Axis:   88  Text Interpretation: Normal sinus rhythm Normal ECG When compared with ECG of 10-Jan-2015 19:55, Sinus rhythm has replaced Atrial flutter Vent. rate has decreased BY  76 BPM Non-specific change in ST segment in Inferior leads T wave inversion no longer evident in Inferior leads T wave inversion no longer evident in Anterior leads Confirmed by Kristeen Miss ([id number]) on 11/17/2022 4:49:18 PM       Recent Labs: 01/05/2022: ALT 63    Lipid Panel    Component Value Date/Time   CHOL 153 01/05/2022 1351   TRIG 200 (H) 01/05/2022 1351   HDL 27 (L) 01/05/2022 1351   CHOLHDL 5.7 (H) 01/05/2022 1351   CHOLHDL 1.9 10/29/2015 1129   VLDL 22 10/29/2015 1129   LDLCALC 91 01/05/2022 1351      Wt Readings from Last 3 Encounters:  11/17/22 198 lb (89.8 kg)  10/21/21 204 lb (92.5 kg)  10/01/20 204 lb (92.5 kg)      Other studies Reviewed: Additional studies/ records that were reviewed today include: LHC Review of the above records demonstrates: see HPI   ASSESSMENT AND PLAN:  Logan Reynolds is a 57 y.o. y.o. male with a history of HTN, tobacco abuse, DMT2 and CAD s/p recent emergent CABG who presents to clinic for post hospital follow up.    CAD/NSTEMI s/p recent emergent CABG x4V: Denies any angina.  He stopped smoking on the night of his heart attack.  He is on Repatha.  Will check lipids, ALT, basic metabolic profile.    2.  PAD :    He has an occluded aorta.  He has seen vascular surgery on 2 occasions but then could not afford to go  back.  I encouraged him to return to see them.  He is having lots of claudication and leg pain.   Hyperlipidemia: -    Continue Repatha and other medications.  Will check labs.   HTN: -Well-controlled.       Signed, Kristeen Miss, MD  11/17/2022 4:26 PM    [facility] [address] [place],  [place], [place]  [address] Phone: [phone]; Fax: [phone]

## 2022-11-17 ENCOUNTER — Ambulatory Visit
Payer: No Typology Code available for payment source | Attending: Cardiovascular Disease | Admitting: Cardiovascular Disease

## 2022-11-17 ENCOUNTER — Encounter: Payer: Self-pay | Admitting: Cardiovascular Disease

## 2022-11-17 VITALS — BP 98/60 | HR 76 | Ht 72.0 in | Wt 198.0 lb

## 2022-11-17 DIAGNOSIS — I2510 Atherosclerotic heart disease of native coronary artery without angina pectoris: Secondary | ICD-10-CM | POA: Diagnosis not present

## 2022-11-17 DIAGNOSIS — E785 Hyperlipidemia, unspecified: Secondary | ICD-10-CM | POA: Diagnosis not present

## 2022-11-17 DIAGNOSIS — Z79899 Other long term (current) drug therapy: Secondary | ICD-10-CM | POA: Diagnosis not present

## 2022-11-17 NOTE — Patient Instructions (Signed)
Medication Instructions:   Your physician recommends that you continue on your current medications as directed. Please refer to the Current Medication list given to you today.  *If you need a refill on your cardiac medications before your next appointment, please call your pharmacy*   Lab Work:  NEXT WEEK HERE IN THE OFFICE--BMET, ALT, AND LIPIDS--PLEASE COME FASTING TO THIS LAB APPOINTMENT  If you have labs (blood work) drawn today and your tests are completely normal, you will receive your results only by: MyChart Message (if you have MyChart) OR A paper copy in the mail If you have any lab test that is abnormal or we need to change your treatment, we will call you to review the results.    Follow-Up: At [facility], you and your health needs are our priority.  As part of our continuing mission to provide you with exceptional heart care, we have created designated Provider Care Teams.  These Care Teams include your primary Cardiologist (physician) and Advanced Practice Providers (APPs -  Physician Assistants and Nurse Practitioners) who all work together to provide you with the care you need, when you need it.  We recommend signing up for the patient portal called "MyChart".  Sign up information is provided on this After Visit Summary.  MyChart is used to connect with patients for Virtual Visits (Telemedicine).  Patients are able to view lab/test results, encounter notes, upcoming appointments, etc.  Non-urgent messages can be sent to your provider as well.   To learn more about what you can do with MyChart, go to [email or web address].    Your next appointment:   1 year(s)  Provider:   DR. HARDING

## 2022-11-25 LAB — BASIC METABOLIC PANEL
BUN/Creatinine Ratio: 14 (ref 9–20)
BUN: 18 mg/dL (ref 6–24)
CO2: 25 mmol/L (ref 20–29)
Calcium: 10.1 mg/dL (ref 8.7–10.2)
Chloride: 100 mmol/L (ref 96–106)
Creatinine, Ser: 1.28 mg/dL — ABNORMAL HIGH (ref 0.76–1.27)
Glucose: 112 mg/dL — ABNORMAL HIGH (ref 70–99)
Potassium: 5 mmol/L (ref 3.5–5.2)
Sodium: 139 mmol/L (ref 134–144)
eGFR: 64 mL/min/{1.73_m2} (ref >59)

## 2022-11-25 LAB — LIPID PANEL
Chol/HDL Ratio: 2 ratio (ref 0.0–5.0)
Cholesterol, Total: 88 mg/dL — ABNORMAL LOW (ref 100–199)
HDL: 43 mg/dL (ref >39)
LDL Chol Calc (NIH): 31 mg/dL (ref 0–99)
Triglycerides: 63 mg/dL (ref 0–149)
VLDL Cholesterol Cal: 14 mg/dL (ref 5–40)

## 2022-11-25 LAB — ALT: ALT: 48 [IU]/L — ABNORMAL HIGH (ref 0–44)

## 2022-12-14 ENCOUNTER — Other Ambulatory Visit: Payer: Self-pay | Admitting: Pharmacist

## 2022-12-14 DIAGNOSIS — E785 Hyperlipidemia, unspecified: Secondary | ICD-10-CM

## 2022-12-14 DIAGNOSIS — I2510 Atherosclerotic heart disease of native coronary artery without angina pectoris: Secondary | ICD-10-CM

## 2022-12-14 MED ORDER — REPATHA SURECLICK 140 MG/ML ~~LOC~~ SOAJ: 140.0000 mg | SUBCUTANEOUS | 3 refills | Status: DC

## 2023-01-12 ENCOUNTER — Ambulatory Visit: Payer: No Typology Code available for payment source | Admitting: Allergy & Immunology

## 2023-08-28 ENCOUNTER — Other Ambulatory Visit (HOSPITAL_COMMUNITY): Payer: Self-pay

## 2023-08-28 ENCOUNTER — Telehealth: Payer: Self-pay | Admitting: Pharmacy Technician

## 2023-08-28 NOTE — Telephone Encounter (Signed)
 Pharmacy Patient Advocate Encounter  Received notification from [employer] that Prior Authorization for repatha  has been APPROVED from 08/28/23 to 08/27/24. Ran test claim, Copay is $50.00- one month. This test claim was processed through [facility]- copay amounts may vary at other pharmacies due to pharmacy/plan contracts, or as the patient moves through the different stages of their insurance plan.   PA #/Case ID/Reference #: [id number]

## 2023-08-28 NOTE — Telephone Encounter (Addendum)
 Pharmacy Patient Advocate Encounter   Received notification from CoverMyMeds that prior authorization for repatha  is required/requested.   Insurance verification completed.   The patient is insured through [employer] .   Per test claim: PA required; PA submitted to above mentioned insurance via Latent Key/confirmation #/EOC [id number] Status is pending

## 2023-11-16 ENCOUNTER — Encounter: Payer: Self-pay | Admitting: Cardiology

## 2023-11-16 ENCOUNTER — Ambulatory Visit: Attending: Cardiology | Admitting: Cardiology

## 2023-11-16 VITALS — BP 90/48 | HR 69 | Ht 72.0 in | Wt 197.4 lb

## 2023-11-16 DIAGNOSIS — I10 Essential (primary) hypertension: Secondary | ICD-10-CM

## 2023-11-16 DIAGNOSIS — I2510 Atherosclerotic heart disease of native coronary artery without angina pectoris: Secondary | ICD-10-CM

## 2023-11-16 DIAGNOSIS — G720 Drug-induced myopathy: Secondary | ICD-10-CM

## 2023-11-16 DIAGNOSIS — Z951 Presence of aortocoronary bypass graft: Secondary | ICD-10-CM

## 2023-11-16 DIAGNOSIS — E1169 Type 2 diabetes mellitus with other specified complication: Secondary | ICD-10-CM

## 2023-11-16 DIAGNOSIS — E7849 Other hyperlipidemia: Secondary | ICD-10-CM

## 2023-11-16 DIAGNOSIS — I214 Non-ST elevation (NSTEMI) myocardial infarction: Secondary | ICD-10-CM

## 2023-11-16 DIAGNOSIS — I7409 Other arterial embolism and thrombosis of abdominal aorta: Secondary | ICD-10-CM

## 2023-11-16 DIAGNOSIS — Z79899 Other long term (current) drug therapy: Secondary | ICD-10-CM

## 2023-11-16 DIAGNOSIS — R0989 Other specified symptoms and signs involving the circulatory and respiratory systems: Secondary | ICD-10-CM

## 2023-11-16 DIAGNOSIS — T466X5D Adverse effect of antihyperlipidemic and antiarteriosclerotic drugs, subsequent encounter: Secondary | ICD-10-CM

## 2023-11-16 DIAGNOSIS — E785 Hyperlipidemia, unspecified: Secondary | ICD-10-CM

## 2023-11-16 MED ORDER — METOPROLOL SUCCINATE ER 25 MG PO TB24: 25.0000 mg | ORAL_TABLET | Freq: Every day | ORAL | 3 refills | Status: AC

## 2023-11-16 NOTE — Progress Notes (Signed)
 Cardiology Office Note:  .   Date:  11/21/2023  ID:  Logan Reynolds, DOB Mar 10, 1965, MRN [id number] PCP: Maree Isles, MD  Vascular Surgeon: Dr. Lonni Gaskins [facility] Providers Cardiologist: Candelario Passe, MD) Logan Clay, MD     Chief Complaint  Patient presents with   Follow-up    Annual visit.  Establish with new cardiologist after Dr. Allena retirement.   Coronary Artery Disease    Status post urgent CABG in 2016 due to left main and LAD disease in setting of non-STEMI.  Also has occluded distal aorta.  Claudication symptoms noted.    Patient Profile: .     Logan Reynolds is a 58 y.o. male with a PMH notable for CAD-CABG, DM-2, HTN, HLD and distal aortic occlusion who presents here for annual cardiology follow-up-establish new Cardiologist at the request of Maree Isles, MD.  Problem List Non-STEMI/MV CAD -severe distal left main as well as bifurcation LAD-D2 stenosis (subtotaled LAD after D2) (01/07/2015) CABG x 4: LIMA-LAD, SeqSVG-D1-D2, SVG-OM Post op atrial fib/flutter-treated with short-term amiodarone , no recurrence Former smoker-quit the night of his MI. DM-2-on Tresiba/insulin , metformin  and Mounjaro ~HTN-on low-dose lisinopril 2.5 mg daily, and Lopressor  25 mg twice daily HLD - Statin Intolerance - elevated LFTs -> on Repatha  140 mg every 14 days Leriche - Chronic distal Aortic Occlusion      Logan Reynolds was last seen on November 17, 2022 by Dr. Passe.  He noted occluded aorta from 2018 with 2 visits seeing vascular surgery but apparently insurance did not cover the visits.  He noted continued leg pain and weakness/calculations especially going uphill.  No complaints of angina  Subjective  Discussed the use of AI scribe software for clinical note transcription with the patient, who gave verbal consent to proceed.  History of Present Illness Logan Reynolds is a 58 year old male with coronary artery disease, diabetes, and  hypertension who presents for follow-up of his cardiovascular health.  He has a history of significant coronary artery disease, having undergone a four-vessel coronary artery bypass graft (CABG) in December 2016 after experiencing a non-ST elevation myocardial infarction (NSTEMI). Prior to the surgery, he experienced chest pain and sweating. Post-surgery, he had a brief episode of atrial fibrillation. He reports no chest pain, shortness of breath, or palpitations.  Two years post-CABG, he was diagnosed with an occluded aorta, discovered during a CT scan for gallbladder issues. He experiences pain in his legs, particularly in the upper legs, when walking on inclines or stairs, which he describes as worsening over time. He can walk on flat surfaces without significant discomfort. He does not report chest pain or pressure during these episodes. He last saw the vascular surgeon, Dr. Gaskins, in 2020.  He has diabetes, currently managed with Mounjaro 2.5 mg, metformin  1000 mg twice daily, Tresiba insulin , and Jardiance. He reports a weight loss of approximately 10 pounds with Mounjaro. His last A1c was 6.9% in August. He also takes Repatha  every 14 days for cholesterol management, as previous medications elevated his liver enzymes.  Hypertension is managed with lisinopril 2.5 mg and metoprolol  25 mg twice daily. His blood pressure typically runs around 120/60 mmHg. He quit smoking in December 2016 after his heart attack.  He does not report symptoms of dizziness, syncope, or leg swelling. He reports occasional neuropathic sensations in his feet, described as 'hands sticking you sometimes'. He has a history of gallbladder removal in December 2018, two years after his heart surgery.  Cardiovascular  ROS: positive for - dyspnea on exertion and this is really with overexertion.  He also just notes fatigue and claudication more so than dyspnea.  Some mild dizziness and fatigue negative for - chest pain, edema,  irregular heartbeat, loss of consciousness, orthopnea, palpitations, paroxysmal nocturnal dyspnea, rapid heart rate, shortness of breath, or near syncope, TIA/amaurosis fugax, melena, hematochezia, hematuria or epistaxis.  ROS:  Review of Systems - Negative except symptoms noted above.  Mostly notes claudication.    Objective   Medications reviewed above. Current Meds CV Medications Sig   aspirin  81 MG EC tablet Take 1 tablet (81 mg total) by mouth daily.   empagliflozin (JARDIANCE) 10 MG TABS tablet Take 10 mg by mouth daily.   Evolocumab  (REPATHA  SURECLICK) 140 MG/ML SOAJ Inject 140 mg into the skin every 14 (fourteen) days.   lisinopril (PRINIVIL,ZESTRIL) 2.5 MG tablet Take 2.5 mg by mouth daily.   metFORMIN  (GLUCOPHAGE ) 500 MG tablet Take 1,000 mg by mouth 2 (two) times daily with a meal.   MOUNJARO 2.5 MG/0.5ML Pen Inject 2.5 mg into the skin once a week.   TRESIBA FLEXTOUCH 100 UNIT/ML FlexTouch Pen Inject 38 Units into the skin daily.   [Changed] metoprolol  tartrate (LOPRESSOR ) 25 MG tablet Take 1 tablet (25 mg total) by mouth 2 (two) times daily.   Jardiance 10 mg Take 1 tab daily--per report    Medication Sig   acetaminophen  (TYLENOL ) 500 MG tablet Take 500-1,000 mg by mouth every 6 (six) hours as needed (PAIN.).   ALPRAZolam  (XANAX ) 0.5 MG tablet Take 0.5 mg by mouth 3 (three) times daily as needed for anxiety.   diclofenac (VOLTAREN) 75 MG EC tablet Take 75 mg by mouth 2 (two) times daily.   empagliflozin (JARDIANCE) 10 MG TABS tablet Take 10 mg by mouth daily.   omeprazole (PRILOSEC) 40 MG capsule Take 40 mg by mouth every evening.      He works as an personnel officer and states he can walk about 1/8 of a mile before his symptoms start in addition states symptoms are worse walking upstairs and can usually walk up to 30 or 40 stairs before symptoms began.  Quit smoking 12/2014  Studies Reviewed: .       Sinus rhythm.  Rate: 69 bpm; Cannot exclude septal infarct, age  undetermined.  No significant change from November 17, 2022..  Lab Results  Component Value Date   CHOL 88 (L) 11/24/2022   HDL 43 11/24/2022   LDLCALC 31 11/24/2022   TRIG 63 11/24/2022   CHOLHDL 2.0 11/24/2022   Lab Results  Component Value Date   HGBA1C 6.9 (H) 12/28/2017   Lab Results  Component Value Date   WBC 6.2 12/28/2017   HGB 14.4 12/28/2017   HCT 46.7 12/28/2017   MCV 90.9 12/28/2017   PLT 310 12/28/2017    Results LABS A1c: 6.9 (09/07/2023)  IntraOp TEE: No report available CATH: Severe distal LM 90% stenosis along with proximal to mid LAD severe disease just after D1 with 99% ostial D2/100% LAD (less than TIMI I flow beyond D2) at D2.  (01/07/2015)   CABG x 4: LIMA-LAD, Seq SVG-D1-D2, SVG-OM (01/07/2015) CT Abd Pelvis (12/2017): 100% Occlusion of Infrarenal Aorta-noted in Dr. Gaylene note.  Risk Assessment/Calculations:          Physical Exam:   VS:  BP (!) 90/48   Pulse 69   Ht 6' (1.829 m)   Wt 197 lb 6.4 oz (89.5 kg)   SpO2 97%  BMI 26.77 kg/m    Wt Readings from Last 3 Encounters:  11/16/23 197 lb 6.4 oz (89.5 kg)  11/17/22 198 lb (89.8 kg)  10/21/21 204 lb (92.5 kg)     GEN: Well nourished, well well-groomed; in no acute distress; relative healthy-appearing. NECK: No JVD; right> left carotid bruit noted. CARDIAC: Normal S1, S2; RRR, no murmurs, rubs, gallops RESPIRATORY:  Clear to auscultation without rales, wheezing or rhonchi ; nonlabored, good air movement. ABDOMEN: Soft, non-tender, non-distended EXTREMITIES:  No edema; No deformity; diminished pedal pulses-barely palpable.  Femoral pulses are palpable but weak.     ASSESSMENT AND PLAN: .    Problem List Items Addressed This Visit       Cardiology Problems   Chronic distal aortic occlusion (HCC) (Chronic)   Worsening claudication symptoms, particularly in upper legs. No sores or significant neuropathy. Discussed potential need for surgical intervention if symptoms worsen. -  Order leg artery dopplers to assess current status. - Order carotid dopplers to evaluate for additional vascular issues. - Re-Refer to Dr. Gretta for follow-up and potential surgical evaluation. - Consider stress test if surgical intervention is planned.      Relevant Medications   metoprolol  succinate (TOPROL  XL) 25 MG 24 hr tablet   Other Relevant Orders   VAS US  LOWER EXTREMITY ARTERIAL DUPLEX   VAS US  ABI WITH/WO TBI   Ambulatory referral to Vascular Surgery   Lipid panel   Comprehensive metabolic panel with GFR   Lipoprotein A (LPA)   Coronary artery disease involving native coronary artery of native heart without angina pectoris - Primary (Chronic)   Status post CABG x 4 in December 2016 for Left Main/LAD/D2 disease in the setting of non-STEMI. No current angina, chest pain, heart failure, or arrhythmia.  Blood pressure well-controlled if not low.. - Continue current medications including aspirin  81 mg, lisinopril 2.5 mg, Lopressor  (reduced to 12.5 mg twice daily until converting to Toprol  25 mg daily), and Repatha  140 mg injection every 2 weeks. - Monitor blood pressure and adjust medications as needed. - Consider stress test if surgical intervention for aortic occlusion is planned.      Relevant Medications   metoprolol  succinate (TOPROL  XL) 25 MG 24 hr tablet   Other Relevant Orders   EKG 12-Lead (Completed)   VAS US  LOWER EXTREMITY ARTERIAL DUPLEX   VAS US  CAROTID   VAS US  ABI WITH/WO TBI   Ambulatory referral to Vascular Surgery   Lipid panel   Comprehensive metabolic panel with GFR   Lipoprotein A (LPA)   HTN (hypertension) (Chronic)   If anything his blood pressure is low today.  He is currently on lisinopril 2.5 mg daily and metoprolol  to tartrate 25 mg twice daily. Reduce Lopressor  to 12.5 mg twice daily until current bottle complete and then convert to Toprol -XL 25 mg daily Continue lisinopril 2.5 mg daily      Relevant Medications   metoprolol  succinate  (TOPROL  XL) 25 MG 24 hr tablet   Other Relevant Orders   Lipid panel   Comprehensive metabolic panel with GFR   Lipoprotein A (LPA)   Hyperlipidemia associated with type 2 diabetes mellitus (HCC) (Chronic)   Lipids managed with Repatha  due to statin intolerance. Cholesterol levels reviewed from last year, well-controlled. - Continue Repatha  every 14 days. - Check follow-up Lipid panel, CMP, as well as LP(a)  Managed with Mounjaro, metformin , [place], and Jardiance. Recent A1c 6.9%. Weight loss noted. - Continue current diabetes medications.      Relevant  Medications   MOUNJARO 2.5 MG/0.5ML Pen   metoprolol  succinate (TOPROL  XL) 25 MG 24 hr tablet   empagliflozin (JARDIANCE) 10 MG TABS tablet   Other Relevant Orders   Lipid panel   Comprehensive metabolic panel with GFR   Lipoprotein A (LPA)   NSTEMI (non-ST elevated myocardial infarction) (HCC) (Chronic)   Relevant Medications   metoprolol  succinate (TOPROL  XL) 25 MG 24 hr tablet   Other Relevant Orders   Lipid panel   Comprehensive metabolic panel with GFR   Lipoprotein A (LPA)     Other   Right carotid bruit   Relevant Orders   VAS US  CAROTID   Lipid panel   Comprehensive metabolic panel with GFR   Lipoprotein A (LPA)   S/P CABG x 4 (Chronic)   9 years out from CABG.  Has not had evaluation since but has been free of symptoms. Based on the severity of his left main and LAD disease I would suspect that his grafts are still patent since he is not having symptoms.  Will hold off on stress test for now, but would consider stress testing if surgical intervention is considered for aortic disease.      Relevant Orders   Lipid panel   Comprehensive metabolic panel with GFR   Lipoprotein A (LPA)   Statin myopathy (Chronic)   Has tried multiple different statins as recorded elsewhere.  Lipids now managed with Repatha .      Other Visit Diagnoses       Medication management       Relevant Orders   EKG 12-Lead  (Completed)   Lipid panel   Comprehensive metabolic panel with GFR   Lipoprotein A (LPA)           Follow-Up: Return in about 1 year (around 11/15/2024) for 1 Yr Follow-up, Routine follow up with me, [employer].  I spent 62 minutes in the care of Logan Reynolds today including reviewing labs (2 minutes), reviewing studies (I personally reviewed his cath films and CABG report-6 minutes), face to face time discussing treatment options (35 minutes discussing his various symptoms and reviewing his reports together.), reviewing records from notes from Dr. Alveta and Dr. Gretta as well as Dr. Lucas postoperatively (8 minutes), 11 minutes dictating, and documenting in the encounter.  Patient is new to me-I have not seen him since his cardiac catheterization almost 9 years ago..  Multiple studies reviewed together while also symptoms discussed.  Referred back to the vascular surgery.  We reviewed his cath films and CABG report.    Signed, Logan MICAEL Clay, MD, MS Logan Clay, M.D., M.S. Interventional Cardiologist  [facility] Pager # [phone]

## 2023-11-16 NOTE — Patient Instructions (Addendum)
 Medication Instructions:   Take Lopressor   ( metoprolol  tartrate) 1/2 tablet twice a day  until the bottle is complete ( ok to completed the 90 day supply) Then start taking Metoprolol  succinate ( TOPROL  XL ) 25 mg in the morning  *If you need a refill on your cardiac medications before your next appointment, please call your pharmacy*   Lab Work: Lipid Lp(a) CMP    Testing/Procedures: 1)Your physician has requested that you have a carotid duplex. This test is an ultrasound of the carotid arteries in your neck. It looks at blood flow through these arteries that supply the brain with blood. Allow one hour for this exam. There are no restrictions or special instructions.   2) Your physician has requested that you have an ankle brachial index (ABI). During this test an ultrasound and blood pressure cuff are used to evaluate the arteries that supply the arms and legs with blood. Allow thirty minutes for this exam. There are no restrictions or special instructions.  Please note: We ask at that you not bring children with you during ultrasound (echo/ vascular) testing. Due to room size and safety concerns, children are not allowed in the ultrasound rooms during exams. Our front office staff cannot provide observation of children in our lobby area while testing is being conducted. An adult accompanying a patient to their appointment will only be allowed in the ultrasound room at the discretion of the ultrasound technician under special circumstances. We apologize for any inconvenience.     3)Your physician has requested that you have a lower extremity arterial duplex. This test is an ultrasound of the arteries in the legs. It looks at arterial blood flow in the legs. Allow one hour for Lower Arterial scans. There are no restrictions or special instructions.  Please note: We ask at that you not bring children with you during ultrasound (echo/ vascular) testing. Due to room size and safety concerns,  children are not allowed in the ultrasound rooms during exams. Our front office staff cannot provide observation of children in our lobby area while testing is being conducted. An adult accompanying a patient to their appointment will only be allowed in the ultrasound room at the discretion of the ultrasound technician under special circumstances. We apologize for any inconvenience.  Follow-Up: At [facility], you and your health needs are our priority.  As part of our continuing mission to provide you with exceptional heart care, we have created designated Provider Care Teams.  These Care Teams include your primary Cardiologist (physician) and Advanced Practice Providers (APPs -  Physician Assistants and Nurse Practitioners) who all work together to provide you with the care you need, when you need it.     Your next appointment:   12 month(s)  The format for your next appointment:   In Person  Provider:   Alm Clay, MD   Other Instructions

## 2023-11-21 ENCOUNTER — Encounter: Payer: Self-pay | Admitting: Cardiology

## 2023-11-21 DIAGNOSIS — G720 Drug-induced myopathy: Secondary | ICD-10-CM | POA: Insufficient documentation

## 2023-11-21 NOTE — Assessment & Plan Note (Addendum)
 Lipids managed with Repatha  due to statin intolerance. Cholesterol levels reviewed from last year, well-controlled. - Continue Repatha  every 14 days. - Check follow-up Lipid panel, CMP, as well as LP(a)  Managed with Mounjaro, metformin , [place], and Jardiance. Recent A1c 6.9%. Weight loss noted. - Continue current diabetes medications.

## 2023-11-21 NOTE — Assessment & Plan Note (Signed)
 If anything his blood pressure is low today.  He is currently on lisinopril 2.5 mg daily and metoprolol  to tartrate 25 mg twice daily. Reduce Lopressor  to 12.5 mg twice daily until current bottle complete and then convert to Toprol -XL 25 mg daily Continue lisinopril 2.5 mg daily

## 2023-11-21 NOTE — Assessment & Plan Note (Signed)
 Has tried multiple different statins as recorded elsewhere.  Lipids now managed with Repatha .

## 2023-11-21 NOTE — Assessment & Plan Note (Signed)
 9 years out from CABG.  Has not had evaluation since but has been free of symptoms. Based on the severity of his left main and LAD disease I would suspect that his grafts are still patent since he is not having symptoms.  Will hold off on stress test for now, but would consider stress testing if surgical intervention is considered for aortic disease.

## 2023-11-21 NOTE — Assessment & Plan Note (Signed)
 Worsening claudication symptoms, particularly in upper legs. No sores or significant neuropathy. Discussed potential need for surgical intervention if symptoms worsen. - Order leg artery dopplers to assess current status. - Order carotid dopplers to evaluate for additional vascular issues. - Re-Refer to Dr. Gretta for follow-up and potential surgical evaluation. - Consider stress test if surgical intervention is planned.

## 2023-11-21 NOTE — Assessment & Plan Note (Signed)
 Status post CABG x 4 in December 2016 for Left Main/LAD/D2 disease in the setting of non-STEMI. No current angina, chest pain, heart failure, or arrhythmia.  Blood pressure well-controlled if not low.. - Continue current medications including aspirin  81 mg, lisinopril 2.5 mg, Lopressor  (reduced to 12.5 mg twice daily until converting to Toprol  25 mg daily), and Repatha  140 mg injection every 2 weeks. - Monitor blood pressure and adjust medications as needed. - Consider stress test if surgical intervention for aortic occlusion is planned.

## 2023-11-23 ENCOUNTER — Other Ambulatory Visit: Payer: Self-pay | Admitting: Cardiology

## 2023-11-23 ENCOUNTER — Ambulatory Visit (HOSPITAL_COMMUNITY)
Admission: RE | Admit: 2023-11-23 | Discharge: 2023-11-23 | Disposition: A | Discharge status: Home / Self Care | Source: Ambulatory Visit | Attending: Cardiology | Admitting: Cardiology

## 2023-11-23 DIAGNOSIS — E7849 Other hyperlipidemia: Secondary | ICD-10-CM

## 2023-11-23 DIAGNOSIS — I739 Peripheral vascular disease, unspecified: Secondary | ICD-10-CM

## 2023-11-23 DIAGNOSIS — R0989 Other specified symptoms and signs involving the circulatory and respiratory systems: Secondary | ICD-10-CM | POA: Insufficient documentation

## 2023-11-23 DIAGNOSIS — I2510 Atherosclerotic heart disease of native coronary artery without angina pectoris: Secondary | ICD-10-CM

## 2023-11-23 DIAGNOSIS — G720 Drug-induced myopathy: Secondary | ICD-10-CM

## 2023-11-23 DIAGNOSIS — I7409 Other arterial embolism and thrombosis of abdominal aorta: Secondary | ICD-10-CM | POA: Insufficient documentation

## 2023-11-23 DIAGNOSIS — I214 Non-ST elevation (NSTEMI) myocardial infarction: Secondary | ICD-10-CM

## 2023-11-23 DIAGNOSIS — Z951 Presence of aortocoronary bypass graft: Secondary | ICD-10-CM

## 2023-11-23 DIAGNOSIS — I10 Essential (primary) hypertension: Secondary | ICD-10-CM

## 2023-11-23 DIAGNOSIS — E1169 Type 2 diabetes mellitus with other specified complication: Secondary | ICD-10-CM

## 2023-11-23 DIAGNOSIS — Z79899 Other long term (current) drug therapy: Secondary | ICD-10-CM

## 2023-11-23 LAB — VAS US ABI WITH/WO TBI
Left ABI: 0.62
Right ABI: 0.79

## 2023-11-24 LAB — COMPREHENSIVE METABOLIC PANEL WITH GFR
ALT: 32 IU/L (ref 0–44)
AST: 32 IU/L (ref 0–40)
Albumin: 4.1 g/dL (ref 3.8–4.9)
Alkaline Phosphatase: 89 IU/L (ref 47–123)
BUN/Creatinine Ratio: 9 — ABNORMAL LOW (ref 10–24)
BUN: 13 mg/dL (ref 8–27)
Bilirubin Total: 0.4 mg/dL (ref 0.0–1.2)
CO2: 22 mmol/L (ref 20–29)
Calcium: 9.1 mg/dL (ref 8.6–10.2)
Chloride: 101 mmol/L (ref 96–106)
Creatinine, Ser: 1.43 mg/dL — ABNORMAL HIGH (ref 0.76–1.27)
Globulin, Total: 2.9 g/dL (ref 1.5–4.5)
Glucose: 111 mg/dL — ABNORMAL HIGH (ref 70–99)
Potassium: 5.2 mmol/L (ref 3.5–5.2)
Sodium: 137 mmol/L (ref 134–144)
Total Protein: 7 g/dL (ref 6.0–8.5)
eGFR: 56 mL/min/1.73 — ABNORMAL LOW (ref >59)

## 2023-11-24 LAB — LIPID PANEL
Chol/HDL Ratio: 2.2 ratio (ref 0.0–5.0)
Cholesterol, Total: 76 mg/dL — ABNORMAL LOW (ref 100–199)
HDL: 34 mg/dL — ABNORMAL LOW (ref >39)
LDL Chol Calc (NIH): 28 mg/dL (ref 0–99)
Triglycerides: 58 mg/dL (ref 0–149)
VLDL Cholesterol Cal: 14 mg/dL (ref 5–40)

## 2023-11-24 LAB — LIPOPROTEIN A (LPA): Lipoprotein (a): <8.4 nmol/L (ref <75.0)

## 2023-11-28 ENCOUNTER — Ambulatory Visit: Payer: Self-pay | Admitting: Cardiology

## 2023-12-10 NOTE — Progress Notes (Unsigned)
 Patient name: Logan Reynolds MRN: [id number] DOB: Mar 26, 1965 Sex: male  REASON FOR CONSULT: Reestablish care  HPI: Logan Reynolds is a 58 y.o. male, with history of coronary artery disease status post MI in 2016, diabetes, hypertension, hyperlipidemia, peripheral arterial disease that presents to reestablish care.  Previously seen on 01/22/2018 with chronic infrarenal occlusion with claudication.  Ultimately we managed his symptoms medically as he did not have disabling claudication.  Today states he feels his symptoms are slightly worse.  When walking up a hill he gets buttock claudication and pain after about 75 feet that requires him to stop.  When walking flat can go 200 feet without stopping.  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  No tissue loss or wounds.  Past Medical History:  Diagnosis Date   Anxiety    Chronic cholecystitis 12/31/2017   Chronic distal aortic occlusion (HCC) 12/11/2017   Coronary artery disease involving left main coronary artery 12/29/2014   CATH: Severe distal LM 90% stenosis along with proximal to mid LAD severe disease just after D1 with 99% ostial D2/100% LAD (less than TIMI I flow beyond D2) at D2.  Mild diffuse disease in the LCx.  Normal RCA.  Referred for CABG.   Diabetes mellitus, type 2 (HCC)    GERD (gastroesophageal reflux disease)    HTN (hypertension)    Hyperlipidemia associated with type 2 diabetes mellitus (HCC) 04/30/2015   Current Medications: none   Intolerances: rosuvastatin  40 mg atorvastatin  80 mg - elevated ALT   Risk Factors: CAD - s/p emergent CABG, DM,hypertension.   LDL goal: <55 mg/dl      Myocardial infarction (HCC) 01/07/2015   Non-STEMI-urgent CABG   Tobacco abuse     Past Surgical History:  Procedure Laterality Date   CARDIAC CATHETERIZATION  01/10/2004   CARDIAC CATHETERIZATION N/A 01/07/2015   Procedure: Left Heart Cath and Coronary Angiography;  Surgeon: Alm LELON Clay, MD;  Location: [facility] INVASIVE CV  LAB;  Service: Cardiovascular; CATH: Severe distal LM 90% stenosis along with proximal to mid LAD severe disease just after D1 with 99% ostial D2/100% LAD (less than TIMI I flow beyond D2) at D2.  Mild diffuse disease in LCx.  Referred for urgent CABG.   CHOLECYSTECTOMY N/A 12/31/2017   Procedure: LAPAROSCOPIC CHOLECYSTECTOMY WITH INTRAOPERATIVE CHOLANGIOGRAM;  Surgeon: Gail Favorite, MD;  Location: [facility] OR;  Service: General;  Laterality: N/A;   CORONARY ARTERY BYPASS GRAFT N/A 01/07/2015   Procedure: CORONARY ARTERY BYPASS GRAFTING (CABG)x 4 using left internal mammary artery and right saphenous leg vein.;  Surgeon: Dorise MARLA Fellers, MD;  Location: [facility] OR;  Service: Open Heart Surgery;  Laterality: N/A;   KNEE ARTHROSCOPY      Family History  Problem Relation Age of Onset   Cancer Mother        ovarian   Cancer Father        bone cancer   Heart attack Father        CABG x5V   Heart disease Father    Diabetes Maternal Grandmother    Diabetes Brother     SOCIAL HISTORY: Social History   Socioeconomic History   Marital status: Married    Spouse name: Not on file   Number of children: Not on file   Years of education: Not on file   Highest education level: Not on file  Occupational History   Not on file  Tobacco Use   Smoking status: Former    Current  packs/day: 1.50    Average packs/day: 1.5 packs/day for 25.0 years (37.5 ttl pk-yrs)    Types: Cigarettes   Smokeless tobacco: Never  Vaping Use   Vaping status: Never Used  Substance and Sexual Activity   Alcohol use: No    Alcohol/week: 0.0 standard drinks of alcohol    Comment: quit about 15 years ago   Drug use: No   Sexual activity: Not on file  Other Topics Concern   Not on file  Social History Narrative   Not on file   Social Drivers of Health   Financial Resource Strain: Not on file  Food Insecurity: Not on file  Transportation Needs: Not on file  Physical Activity: Not on file  Stress: Not on file  Social  Connections: Not on file  Intimate Partner Violence: Not on file    No Known Allergies  Current Outpatient Medications  Medication Sig Dispense Refill   acetaminophen  (TYLENOL ) 500 MG tablet Take 500-1,000 mg by mouth every 6 (six) hours as needed (PAIN.).     ALPRAZolam  (XANAX ) 0.5 MG tablet Take 0.5 mg by mouth 3 (three) times daily as needed for anxiety.     aspirin  81 MG EC tablet Take 1 tablet (81 mg total) by mouth daily.     diclofenac (VOLTAREN) 75 MG EC tablet Take 75 mg by mouth 2 (two) times daily.     empagliflozin (JARDIANCE) 10 MG TABS tablet Take 10 mg by mouth daily.     Evolocumab  (REPATHA  SURECLICK) 140 MG/ML SOAJ Inject 140 mg into the skin every 14 (fourteen) days. 6 mL 3   lisinopril (PRINIVIL,ZESTRIL) 2.5 MG tablet Take 2.5 mg by mouth daily.     metFORMIN  (GLUCOPHAGE ) 500 MG tablet Take 1,000 mg by mouth 2 (two) times daily with a meal.     metoprolol  succinate (TOPROL  XL) 25 MG 24 hr tablet Take 1 tablet (25 mg total) by mouth daily. 90 tablet 3   MOUNJARO 2.5 MG/0.5ML Pen Inject 2.5 mg into the skin once a week.     omeprazole (PRILOSEC) 40 MG capsule Take 40 mg by mouth every evening.   1   TRESIBA FLEXTOUCH 100 UNIT/ML FlexTouch Pen Inject 38 Units into the skin daily.     No current facility-administered medications for this visit.    REVIEW OF SYSTEMS:  [X]  denotes positive finding, [ ]  denotes negative finding Cardiac  Comments:  Chest pain or chest pressure:    Shortness of breath upon exertion:    Short of breath when lying flat:    Irregular heart rhythm:        Vascular    Pain in calf, thigh, or hip brought on by ambulation: x Bilateral   Pain in feet at night that wakes you up from your sleep:     Blood clot in your veins:    Leg swelling:         Pulmonary    Oxygen at home:    Productive cough:     Wheezing:         Neurologic    Sudden weakness in arms or legs:     Sudden numbness in arms or legs:     Sudden onset of difficulty  speaking or slurred speech:    Temporary loss of vision in one eye:     Problems with dizziness:         Gastrointestinal    Blood in stool:     Vomited blood:  Genitourinary    Burning when urinating:     Blood in urine:        Psychiatric    Major depression:         Hematologic    Bleeding problems:    Problems with blood clotting too easily:        Skin    Rashes or ulcers:        Constitutional    Fever or chills:      PHYSICAL EXAM: There were no vitals filed for this visit.  GENERAL: The patient is a well-nourished male, in no acute distress. The vital signs are documented above. CARDIAC: There is a regular rate and rhythm.  VASCULAR:  No palpable femoral pulses No palpable pedal pulses No tissue loss PULMONARY: No respiratory distress. ABDOMEN: Soft and non-tender. MUSCULOSKELETAL: There are no major deformities or cyanosis. NEUROLOGIC: No focal weakness or paresthesias are detected. PSYCHIATRIC: The patient has a normal affect.  DATA:   ABIs 0.79 on the right and 0.62 on the left  Carotid shows normal right ICA and 1 to 39% left ICA stenosis  Lower extremity duplex shows occluded aorta and bilateral iliacs as previously demonstrated  Assessment/Plan:  58 y.o. male, with history of coronary artery disease status post MI in 2016, diabetes, hypertension, hyperlipidemia, peripheral arterial disease that presents to reestablish care.  Previously seen on 01/22/2018 with chronic infrarenal occlusion with claudication.  Ultimately we managed his symptoms medically as he did not have disabling claudication.  I discussed that his duplex studies again showed the occluded aorta and iliacs as previously demonstrated in 2020.  Against symptoms are consistent with intermittent lower extremity claudication.  I had a long discussion with him about intervention is elective and we should intervene when he feels his symptoms are disabling and he cannot carry out his  daily activities.  I discussed he would need an updated CTA with runoff and cardiac clearance in order to proceed with likely aortobifemoral bypass as the best long-term option for him.  Ultimately his wife would like him to delay surgery and he is going take more time to think about it.  Will follow-up in 6 months with ABIs.  Discussed he call if worsening symptoms.   Lonni DOROTHA Gaskins, MD [facility] Office: [phone]

## 2023-12-11 ENCOUNTER — Ambulatory Visit: Admitting: Vascular Surgery

## 2023-12-11 ENCOUNTER — Encounter: Payer: Self-pay | Admitting: Vascular Surgery

## 2023-12-11 VITALS — BP 104/62 | HR 70 | Resp 16 | Ht 72.0 in | Wt 201.0 lb

## 2023-12-11 DIAGNOSIS — I70213 Atherosclerosis of native arteries of extremities with intermittent claudication, bilateral legs: Secondary | ICD-10-CM | POA: Diagnosis not present

## 2023-12-11 DIAGNOSIS — I70219 Atherosclerosis of native arteries of extremities with intermittent claudication, unspecified extremity: Secondary | ICD-10-CM | POA: Insufficient documentation

## 2023-12-11 DIAGNOSIS — I7409 Other arterial embolism and thrombosis of abdominal aorta: Secondary | ICD-10-CM | POA: Diagnosis not present

## 2023-12-13 ENCOUNTER — Other Ambulatory Visit: Payer: Self-pay | Admitting: *Deleted

## 2023-12-13 DIAGNOSIS — I70213 Atherosclerosis of native arteries of extremities with intermittent claudication, bilateral legs: Secondary | ICD-10-CM

## 2023-12-24 ENCOUNTER — Telehealth: Payer: Self-pay | Admitting: Cardiology

## 2023-12-24 DIAGNOSIS — I2510 Atherosclerotic heart disease of native coronary artery without angina pectoris: Secondary | ICD-10-CM

## 2023-12-24 DIAGNOSIS — E785 Hyperlipidemia, unspecified: Secondary | ICD-10-CM

## 2023-12-24 NOTE — Telephone Encounter (Signed)
°*  STAT* If patient is at the pharmacy, call can be transferred to refill team.   1. Which medications need to be refilled? (please list name of each medication and dose if known) Evolocumab  (REPATHA  SURECLICK) 140 MG/ML SOAJ    2. Would you like to learn more about the convenience, safety, & potential cost savings by using the [facility]?   3. Are you open to using the [facility] (Type [facility].  ).   4. Which pharmacy/location (including street and city if local pharmacy) is medication to be sent to?  [facility] - [place], [place] - [address]    5. Do they need a 30 day or 90 day supply? 30 day

## 2023-12-25 MED ORDER — REPATHA SURECLICK 140 MG/ML ~~LOC~~ SOAJ: 140.0000 mg | SUBCUTANEOUS | 1 refills | Status: AC

## 2023-12-28 NOTE — Telephone Encounter (Signed)
Pt calling to f/u; please advise

## 2023-12-28 NOTE — Telephone Encounter (Signed)
 Spoke with patient regarding Repatha . Medication was sent to [facility] instead of [facility], cleared up the confusion. Also made [facility] the primary pharmacy per patient request. Verbalized understanding and all questions answered.

## 2024-07-15 ENCOUNTER — Ambulatory Visit: Admitting: Vascular Surgery

## 2024-07-15 ENCOUNTER — Encounter
